# Patient Record
Sex: Female | Born: 1955 | Race: White | Hispanic: No | Marital: Married | Smoking: Never smoker
Health system: Southern US, Community
[De-identification: ages and names within clinical notes are randomized; demographics above are authoritative.]

## PROBLEM LIST (undated history)

## (undated) DIAGNOSIS — E559 Vitamin D deficiency, unspecified: Secondary | ICD-10-CM

## (undated) DIAGNOSIS — M1990 Unspecified osteoarthritis, unspecified site: Secondary | ICD-10-CM

## (undated) DIAGNOSIS — I10 Essential (primary) hypertension: Secondary | ICD-10-CM

## (undated) DIAGNOSIS — F419 Anxiety disorder, unspecified: Secondary | ICD-10-CM

## (undated) DIAGNOSIS — M810 Age-related osteoporosis without current pathological fracture: Secondary | ICD-10-CM

## (undated) DIAGNOSIS — G8929 Other chronic pain: Secondary | ICD-10-CM

## (undated) HISTORY — DX: Anxiety disorder, unspecified: F41.9

## (undated) HISTORY — DX: Essential (primary) hypertension: I10

## (undated) HISTORY — PX: KNEE ARTHROSCOPY W/ MENISCAL REPAIR: SHX1877

## (undated) HISTORY — DX: Age-related osteoporosis without current pathological fracture: M81.0

## (undated) HISTORY — DX: Unspecified osteoarthritis, unspecified site: M19.90

## (undated) HISTORY — DX: Other chronic pain: G89.29

## (undated) HISTORY — DX: Vitamin D deficiency, unspecified: E55.9

---

## 2007-12-02 ENCOUNTER — Other Ambulatory Visit: Admission: RE | Admit: 2007-12-02 | Discharge: 2007-12-02 | Payer: Self-pay | Admitting: Family Medicine

## 2007-12-02 LAB — BASIC METABOLIC PANEL
BUN: 14 mg/dL (ref 4–21)
Creatinine: 0.7 mg/dL (ref 0.5–1.1)
GLUCOSE: 89 mg/dL
POTASSIUM: 4.6 mmol/L (ref 3.4–5.3)
Sodium: 141 mmol/L (ref 137–147)

## 2007-12-02 LAB — TSH: TSH: 1.12 u[IU]/mL (ref 0.41–5.90)

## 2007-12-02 LAB — LIPID PANEL
Cholesterol: 169 mg/dL (ref 0–200)
HDL: 51 mg/dL (ref 35–70)
LDL Cholesterol: 111 mg/dL
Triglycerides: 93 mg/dL (ref 40–160)

## 2007-12-02 LAB — HEPATIC FUNCTION PANEL
ALT: 26 U/L (ref 7–35)
AST: 23 U/L (ref 13–35)
Alkaline Phosphatase: 51 U/L (ref 25–125)
BILIRUBIN, TOTAL: 0.6 mg/dL

## 2009-04-16 ENCOUNTER — Ambulatory Visit: Payer: Self-pay | Admitting: Occupational Medicine

## 2009-04-16 DIAGNOSIS — S139XXA Sprain of joints and ligaments of unspecified parts of neck, initial encounter: Secondary | ICD-10-CM | POA: Insufficient documentation
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
## 2013-02-14 ENCOUNTER — Ambulatory Visit (INDEPENDENT_AMBULATORY_CARE_PROVIDER_SITE_OTHER): Payer: BC Managed Care – PPO | Admitting: Physician Assistant

## 2013-02-14 ENCOUNTER — Encounter: Payer: Self-pay | Admitting: Physician Assistant

## 2013-02-14 VITALS — BP 130/74 | HR 101 | Temp 98.0°F | Wt 188.0 lb

## 2013-02-14 DIAGNOSIS — Z13220 Encounter for screening for lipoid disorders: Secondary | ICD-10-CM

## 2013-02-14 DIAGNOSIS — R5381 Other malaise: Secondary | ICD-10-CM

## 2013-02-14 DIAGNOSIS — S00421A Blister (nonthermal) of right ear, initial encounter: Secondary | ICD-10-CM

## 2013-02-14 DIAGNOSIS — R509 Fever, unspecified: Secondary | ICD-10-CM

## 2013-02-14 DIAGNOSIS — S1092XA Blister (nonthermal) of unspecified part of neck, initial encounter: Secondary | ICD-10-CM

## 2013-02-14 DIAGNOSIS — J029 Acute pharyngitis, unspecified: Secondary | ICD-10-CM

## 2013-02-14 DIAGNOSIS — Z20818 Contact with and (suspected) exposure to other bacterial communicable diseases: Secondary | ICD-10-CM

## 2013-02-14 DIAGNOSIS — R5383 Other fatigue: Secondary | ICD-10-CM

## 2013-02-14 DIAGNOSIS — Z131 Encounter for screening for diabetes mellitus: Secondary | ICD-10-CM

## 2013-02-14 DIAGNOSIS — S0002XA Blister (nonthermal) of scalp, initial encounter: Secondary | ICD-10-CM

## 2013-02-14 DIAGNOSIS — S0082XA Blister (nonthermal) of other part of head, initial encounter: Secondary | ICD-10-CM

## 2013-02-14 DIAGNOSIS — Z2089 Contact with and (suspected) exposure to other communicable diseases: Secondary | ICD-10-CM

## 2013-02-14 LAB — POCT RAPID STREP A (OFFICE): Rapid Strep A Screen: NEGATIVE

## 2013-02-14 MED ORDER — AMOXICILLIN 500 MG PO TABS: 500.0000 mg | ORAL_TABLET | Freq: Two times a day (BID) | ORAL | Status: DC

## 2013-02-14 NOTE — Patient Instructions (Signed)
Mucinex DM twice a day. If not improving in next 48 hours then try Amoxil for 10 days.   Viral Pharyngitis Viral pharyngitis is a viral infection that produces redness, pain, and swelling (inflammation) of the throat. It can spread from person to person (contagious). CAUSES Viral pharyngitis is caused by inhaling a large amount of certain germs called viruses. Many different viruses cause viral pharyngitis. SYMPTOMS Symptoms of viral pharyngitis include:  Sore throat.  Tiredness.  Stuffy nose.  Low-grade fever.  Congestion.  Cough. TREATMENT Treatment includes rest, drinking plenty of fluids, and the use of over-the-counter medication (approved by your caregiver). HOME CARE INSTRUCTIONS   Drink enough fluids to keep your urine clear or pale yellow.  Eat soft, cold foods such as ice cream, frozen ice pops, or gelatin dessert.  Gargle with warm salt water (1 tsp salt per 1 qt of water).  If over age 357, throat lozenges may be used safely.  Only take over-the-counter or prescription medicines for pain, discomfort, or fever as directed by your caregiver. Do not take aspirin. To help prevent spreading viral pharyngitis to others, avoid:  Mouth-to-mouth contact with others.  Sharing utensils for eating and drinking.  Coughing around others. SEEK MEDICAL CARE IF:   You are better in a few days, then become worse.  You have a fever or pain not helped by pain medicines.  There are any other changes that concern you. Document Released: 11/06/2004 Document Revised: 04/21/2011 Document Reviewed: 04/04/2010 [facility] Patient Information 2014 [place], [place].

## 2013-02-14 NOTE — Progress Notes (Signed)
   Subjective:    Patient ID: Beverly Coffey, female    DOB: November 01, 1955, 58 y.o.   MRN: [id number]  HPI Patient is a 58 year old WF who presents to the clinic to establish care. Patient has no ongoing past medical history. She has not seen a doctor in many years and is behind on all health maintenance. She presents today with 5 days of fever, chills, sore throat and sinus pressure. She reports a home temperature of 101.7. She's been taking ibuprofen to keep temperature down. Ibuprofen helps minimally with symptoms that has helped with fever control. Patient denies any ear pain, shortness of breath or wheezing. She does have a dry cough. Her 3 grandchildren were diagnosed with strep last week and she was around them. She continues to eat and drink.    Review of Systems     Objective:   Physical Exam  Constitutional: She is oriented to person, place, and time. She appears well-developed and well-nourished.  HENT:  Head: Normocephalic and atraumatic.  Right Ear: External ear normal.  Left Ear: External ear normal.  Right TM small blister like formation at 6 o clock. Tympanic scarring present.   Left TM clear.   Oropharynx erythematous. Tonsils not enlarged and no exudate.   Eyes: Conjunctivae are normal. Right eye exhibits no discharge. Left eye exhibits no discharge.  Neck: Normal range of motion. Neck supple.  Cardiovascular: Regular rhythm and normal heart sounds.   Tachycardia at 101.  Pulmonary/Chest: Effort normal and breath sounds normal. She has no wheezes.  Lymphadenopathy:    She has no cervical adenopathy.  Neurological: She is alert and oriented to person, place, and time.  Skin: Skin is warm and dry.  Psychiatric: She has a normal mood and affect. Her behavior is normal.          Assessment & Plan:  Acute pharyngitis- Rapid strep negative. Discuss with patient that sore throat is likely coming from sinus drainage. Suggested getting Mucinex DM and use twice a day.  Encouraged patient to consider symptomatic care for sore throat. Honey could help with cough cough as well as sore throat. If not improving in the next 48 hours to give Rx for Amoxil for 10 days if needed.  Right TM ear blister- it appears to be a ear blister on right ear. Pt not aware of any problems with ear. She does report some hearing loss but have been over many years. No gross hearing loss. Will follow up in 4 weeks to see if resolved or still present.   Gave labs for screening purposes. Will order mammo. Follow up in 4 weeks for CPE/PAP.

## 2013-03-14 ENCOUNTER — Encounter: Payer: Self-pay | Admitting: *Deleted

## 2013-03-14 ENCOUNTER — Encounter: Payer: Self-pay | Admitting: Physician Assistant

## 2013-03-14 ENCOUNTER — Other Ambulatory Visit (HOSPITAL_COMMUNITY)
Admission: RE | Admit: 2013-03-14 | Discharge: 2013-03-14 | Disposition: A | Discharge status: Home / Self Care | Payer: BC Managed Care – PPO | Source: Ambulatory Visit | Attending: Family Medicine | Admitting: Family Medicine

## 2013-03-14 ENCOUNTER — Ambulatory Visit (INDEPENDENT_AMBULATORY_CARE_PROVIDER_SITE_OTHER): Payer: BC Managed Care – PPO | Admitting: Physician Assistant

## 2013-03-14 VITALS — BP 122/78 | HR 66 | Wt 188.0 lb

## 2013-03-14 DIAGNOSIS — Z131 Encounter for screening for diabetes mellitus: Secondary | ICD-10-CM

## 2013-03-14 DIAGNOSIS — Z1211 Encounter for screening for malignant neoplasm of colon: Secondary | ICD-10-CM

## 2013-03-14 DIAGNOSIS — R140 Abdominal distension (gaseous): Secondary | ICD-10-CM

## 2013-03-14 DIAGNOSIS — Z1239 Encounter for other screening for malignant neoplasm of breast: Secondary | ICD-10-CM

## 2013-03-14 DIAGNOSIS — Z13220 Encounter for screening for lipoid disorders: Secondary | ICD-10-CM

## 2013-03-14 DIAGNOSIS — R142 Eructation: Secondary | ICD-10-CM

## 2013-03-14 DIAGNOSIS — Z01419 Encounter for gynecological examination (general) (routine) without abnormal findings: Secondary | ICD-10-CM | POA: Insufficient documentation

## 2013-03-14 DIAGNOSIS — R141 Gas pain: Secondary | ICD-10-CM

## 2013-03-14 DIAGNOSIS — R109 Unspecified abdominal pain: Secondary | ICD-10-CM

## 2013-03-14 DIAGNOSIS — Z1151 Encounter for screening for human papillomavirus (HPV): Secondary | ICD-10-CM | POA: Insufficient documentation

## 2013-03-14 DIAGNOSIS — IMO0001 Reserved for inherently not codable concepts without codable children: Secondary | ICD-10-CM

## 2013-03-14 DIAGNOSIS — R143 Flatulence: Secondary | ICD-10-CM

## 2013-03-14 DIAGNOSIS — Z0000 Encounter for general adult medical examination without abnormal findings: Secondary | ICD-10-CM

## 2013-03-14 DIAGNOSIS — R1030 Lower abdominal pain, unspecified: Secondary | ICD-10-CM

## 2013-03-14 LAB — COMPLETE METABOLIC PANEL WITH GFR
ALBUMIN: 4.4 g/dL (ref 3.5–5.2)
ALT: 33 U/L (ref 0–35)
AST: 26 U/L (ref 0–37)
Alkaline Phosphatase: 71 U/L (ref 39–117)
BUN: 23 mg/dL (ref 6–23)
CHLORIDE: 109 meq/L (ref 96–112)
CO2: 23 mEq/L (ref 19–32)
Calcium: 9.2 mg/dL (ref 8.4–10.5)
Creat: 0.76 mg/dL (ref 0.50–1.10)
GFR, Est African American: >89 mL/min
GFR, Est Non African American: 89 mL/min
Glucose, Bld: 90 mg/dL (ref 70–99)
POTASSIUM: 4.4 meq/L (ref 3.5–5.3)
Sodium: 140 mEq/L (ref 135–145)
Total Bilirubin: 0.5 mg/dL (ref 0.2–1.2)
Total Protein: 7 g/dL (ref 6.0–8.3)

## 2013-03-14 LAB — LIPID PANEL
CHOLESTEROL: 194 mg/dL (ref 0–200)
HDL: 48 mg/dL (ref >39)
LDL Cholesterol: 128 mg/dL — ABNORMAL HIGH (ref 0–99)
Total CHOL/HDL Ratio: 4 Ratio
Triglycerides: 92 mg/dL (ref <150)
VLDL: 18 mg/dL (ref 0–40)

## 2013-03-14 LAB — SEDIMENTATION RATE: Sed Rate: 13 mm/hr (ref 0–22)

## 2013-03-14 LAB — TSH: TSH: 1.249 u[IU]/mL (ref 0.350–4.500)

## 2013-03-14 NOTE — Patient Instructions (Addendum)
Will refer to orthopedic specialist.  Pelvic ultrasound.(WED Afternoon) Colonoscopy Baby asprin daily 81 mg.  Ibuprofen 400-600mg  as needed up to three times a day.  Glucosamine chondrotin.    Diet and Irritable Bowel Syndrome  No cure has been found for irritable bowel syndrome (IBS). Many options are available to treat the symptoms. Your caregiver will give you the best treatments available for your symptoms. He or she will also encourage you to manage stress and to make changes to your diet. You need to work with your caregiver and Registered Dietician to find the best combination of medicine, diet, counseling, and support to control your symptoms. The following are some diet suggestions. FOODS THAT MAKE IBS WORSE  Fatty foods, such as [place] fries.  Milk products, such as cheese or ice cream.  Chocolate.  Alcohol.  Caffeine (found in coffee and some sodas).  Carbonated drinks, such as soda. If certain foods cause symptoms, you should eat less of them or stop eating them. FOOD JOURNAL   Keep a journal of the foods that seem to cause distress. Write down:  What you are eating during the day and when.  What problems you are having after eating.  When the symptoms occur in relation to your meals.  What foods always make you feel badly.  Take your notes with you to your caregiver to see if you should stop eating certain foods. FOODS THAT MAKE IBS BETTER Fiber reduces IBS symptoms, especially constipation, because it makes stools soft, bulky, and easier to pass. Fiber is found in bran, bread, cereal, beans, fruit, and vegetables. Examples of foods with fiber include:  Apples.  Peaches.  Pears.  Berries.  Figs.  Broccoli, raw.  Cabbage.  Carrots.  Raw peas.  Kidney beans.  Lima beans.  Whole-grain bread.  Whole-grain cereal. Add foods with fiber to your diet a little at a time. This will let your body get used to them. Too much fiber at once might cause  gas and swelling of your abdomen. This can trigger symptoms in a person with IBS. Caregivers usually recommend a diet with enough fiber to produce soft, painless bowel movements. High fiber diets may cause gas and bloating. However, these symptoms often go away within a few weeks, as your body adjusts. In many cases, dietary fiber may lessen IBS symptoms, particularly constipation. However, it may not help pain or diarrhea. High fiber diets keep the colon mildly enlarged (distended) with the added fiber. This may help prevent spasms in the colon. Some forms of fiber also keep water in the stool, thereby preventing hard stools that are difficult to pass.  Besides telling you to eat more foods with fiber, your caregiver may also tell you to get more fiber by taking a fiber pill or drinking water mixed with a special high fiber powder. An example of this is a natural fiber laxative containing psyllium seed.  TIPS  Large meals can cause cramping and diarrhea in people with IBS. If this happens to you, try eating 4 or 5 small meals a day, or try eating less at each of your usual 3 meals. It may also help if your meals are low in fat and high in carbohydrates. Examples of carbohydrates are pasta, rice, whole-grain breads and cereals, fruits, and vegetables.  If dairy products cause your symptoms to flare up, you can try eating less of those foods. You might be able to handle yogurt better than other dairy products, because it contains bacteria that helps  with digestion. Dairy products are an important source of calcium and other nutrients. If you need to avoid dairy products, be sure to talk with a Registered Dietitian about getting these nutrients through other food sources.  Drink enough water and fluids to keep your urine clear or pale yellow. This is important, especially if you have diarrhea. FOR MORE INFORMATION  International Foundation for Functional Gastrointestinal Disorders: www.iffgd.org  National  Digestive Diseases Information Clearinghouse: digestive.StageSync.si Document Released: 04/19/2003 Document Revised: 04/21/2011 Document Reviewed: 01/04/2007 ExitCare Patient Information 2014 ExitCare, [place].    wKeeping You Healthy  Get These Tests  Blood Pressure- Have your blood pressure checked by your healthcare provider at least once a year.  Normal blood pressure is 120/80.  Weight- Have your body mass index (BMI) calculated to screen for obesity.  BMI is a measure of body fat based on height and weight.  You can calculate your own BMI at https://www.west-esparza.com/  Cholesterol- Have your cholesterol checked every year.  Diabetes- Have your blood sugar checked every year if you have high blood pressure, high cholesterol, a family history of diabetes or if you are overweight.  Pap Smear- Have a pap smear every 1 to 3 years if you have been sexually active.  If you are older than 65 and recent pap smears have been normal you may not need additional pap smears.  In addition, if you have had a hysterectomy  For benign disease additional pap smears are not necessary.  Mammogram-Yearly mammograms are essential for early detection of breast cancer  Screening for Colon Cancer- Colonoscopy starting at age 45. Screening may begin sooner depending on your family history and other health conditions.  Follow up colonoscopy as directed by your Gastroenterologist.  Screening for Osteoporosis- Screening begins at age 74 with bone density scanning, sooner if you are at higher risk for developing Osteoporosis.  Get these medicines  Calcium with Vitamin D- Your body requires 1200-1500 mg of Calcium a day and [phone] IU of Vitamin D a day.  You can only absorb 500 mg of Calcium at a time therefore Calcium must be taken in 2 or 3 separate doses throughout the day.  Hormones- Hormone therapy has been associated with increased risk for certain cancers and heart disease.  Talk to your healthcare  provider about if you need relief from menopausal symptoms.  Aspirin- Ask your healthcare provider about taking Aspirin to prevent Heart Disease and Stroke.  Get these Immuniztions  Flu shot- Every fall  Pneumonia shot- Once after the age of 44; if you are younger ask your healthcare provider if you need a pneumonia shot.  Tetanus- Every ten years.  Zostavax- Once after the age of 39 to prevent shingles.  Take these steps  Don't smoke- Your healthcare provider can help you quit. For tips on how to quit, ask your healthcare provider or go to www.smokefree.gov or call 1-800 QUIT-NOW.  Be physically active- Exercise 5 days a week for a minimum of 30 minutes.  If you are not already physically active, start slow and gradually work up to 30 minutes of moderate physical activity.  Try walking, dancing, bike riding, swimming, etc.  Eat a healthy diet- Eat a variety of healthy foods such as fruits, vegetables, whole grains, low fat milk, low fat cheeses, yogurt, lean meats, chicken, fish, eggs, dried beans, tofu, etc.  For more information go to www.thenutritionsource.org  Dental visit- Brush and floss teeth twice daily; visit your dentist twice a year.  Eye exam- Visit  your Optometrist or Ophthalmologist yearly.  Drink alcohol in moderation- Limit alcohol intake to one drink or less a day.  Never drink and drive.  Depression- Your emotional health is as important as your physical health.  If you're feeling down or losing interest in things you normally enjoy, please talk to your healthcare provider.  Seat Belts- can save your life; always wear one  Smoke/Carbon Monoxide detectors- These detectors need to be installed on the appropriate level of your home.  Replace batteries at least once a year.  Violence- If anyone is threatening or hurting you, please tell your healthcare provider.  Living Will/ Health care power of attorney- Discuss with your healthcare provider and  family.  Menopause Menopause is the normal time of life when menstrual periods stop completely. Menopause is complete when you have missed 12 consecutive menstrual periods. It usually occurs between the ages of 48 years and 55 years. Very rarely does a woman develop menopause before the age of 40 years. At menopause, your ovaries stop producing the female hormones estrogen and progesterone. This can cause undesirable symptoms and also affect your health. Sometimes the symptoms may occur 4 5 years before the menopause begins. There is no relationship between menopause and:  Oral contraceptives.  Number of children you had.  Race.  The age your menstrual periods started (menarche). Heavy smokers and very thin women may develop menopause earlier in life. CAUSES  The ovaries stop producing the female hormones estrogen and progesterone.  Other causes include:  Surgery to remove both ovaries.  The ovaries stop functioning for no known reason.  Tumors of the pituitary gland in the brain.  Medical disease that affects the ovaries and hormone production.  Radiation treatment to the abdomen or pelvis.  Chemotherapy that affects the ovaries. SYMPTOMS   Hot flashes.  Night sweats.  Decrease in sex drive.  Vaginal dryness and thinning of the vagina causing painful intercourse.  Dryness of the skin and developing wrinkles.  Headaches.  Tiredness.  Irritability.  Memory problems.  Weight gain.  Bladder infections.  Hair growth of the face and chest.  Infertility. More serious symptoms include:  Loss of bone (osteoporosis) causing breaks (fractures).  Depression.  Hardening and narrowing of the arteries (atherosclerosis) causing heart attacks and strokes. DIAGNOSIS   When the menstrual periods have stopped for 12 straight months.  Physical exam.  Hormone studies of the blood. TREATMENT  There are many treatment choices and nearly as many questions about them.  The decisions to treat or not to treat menopausal changes is an individual choice made with your health care provider. Your health care provider can discuss the treatments with you. Together, you can decide which treatment will work best for you. Your treatment choices may include:   Hormone therapy (estrogen and progesterone).  Non-hormonal medicines.  Treating the individual symptoms with medicine (for example antidepressants for depression).  Herbal medicines that may help specific symptoms.  Counseling by a psychiatrist or psychologist.  Group therapy.  Lifestyle changes including:  Eating healthy.  Regular exercise.  Limiting caffeine and alcohol.  Stress management and meditation.  No treatment. HOME CARE INSTRUCTIONS   Take the medicine your health care provider gives you as directed.  Get plenty of sleep and rest.  Exercise regularly.  Eat a diet that contains calcium (good for the bones) and soy products (acts like estrogen hormone).  Avoid alcoholic beverages.  Do not smoke.  If you have hot flashes, dress in layers.  Take  supplements, calcium, and vitamin D to strengthen bones.  You can use over-the-counter lubricants or moisturizers for vaginal dryness.  Group therapy is sometimes very helpful.  Acupuncture may be helpful in some cases. SEEK MEDICAL CARE IF:   You are not sure you are in menopause.  You are having menopausal symptoms and need advice and treatment.  You are still having menstrual periods after age 49 years.  You have pain with intercourse.  Menopause is complete (no menstrual period for 12 months) and you develop vaginal bleeding.  You need a referral to a specialist (gynecologist, psychiatrist, or psychologist) for treatment. SEEK IMMEDIATE MEDICAL CARE IF:   You have severe depression.  You have excessive vaginal bleeding.  You fell and think you have a broken bone.  You have pain when you urinate.  You develop leg  or chest pain.  You have a fast pounding heart beat (palpitations).  You have severe headaches.  You develop vision problems.  You feel a lump in your breast.  You have abdominal pain or severe indigestion. Document Released: 04/19/2003 Document Revised: 09/29/2012 Document Reviewed: 08/26/2012 [facility] Patient Information 2014 [place], [place].

## 2013-03-14 NOTE — Progress Notes (Signed)
Subjective:    Patient ID: Beverly Coffey, female    DOB: Apr 24, 1955, 58 y.o.   MRN: [id number]  HPI    Review of Systems     Objective:   Physical Exam        Assessment & Plan:   Subjective:     Beverly Coffey is a 58 y.o. female and is here for a comprehensive physical exam. The patient reports problems - Pt has not seen doctor in over 10 years other than acute sick visits. She comes in concerned with bloating, and tightness of lower abdomen. This has been going on for over a year. bowel movements have not changed but admits to her stools being "loose" sometimes. she is also concerned because her muscles ache all over. denies any joint pain but muscles feel weak and tired. .  No period in over 2 years. Complains of being a little moody and gaining weight.   History   Social History  . Marital Status: Married    Spouse Name: N/A    Number of Children: N/A  . Years of Education: N/A   Occupational History  . Not on file.   Social History Main Topics  . Smoking status: Never Smoker   . Smokeless tobacco: Not on file  . Alcohol Use: No  . Drug Use: No  . Sexual Activity: Yes   Other Topics Concern  . Not on file   Social History Narrative  . No narrative on file   Health Maintenance  Topic Date Due  . Pap Smear  10/21/1975  . Mammogram  10/21/2007  . Colonoscopy  10/21/2007  . Influenza Vaccine  09/10/2012  . Tetanus/tdap  12/01/2017    The following portions of the patient's history were reviewed and updated as appropriate: allergies, current medications, past family history, past medical history, past social history, past surgical history and problem list.  Review of Systems Pertinent items are noted in HPI.   Objective:    BP 122/78  Pulse 66  Wt 188 lb (85.276 kg) General appearance: alert, cooperative and appears stated age Head: Normocephalic, without obvious abnormality, atraumatic Eyes: conjunctivae/corneas clear. PERRL, EOM's intact.  Fundi benign. Ears: normal TM's and external ear canals both ears small fluid filled cyst of right ear seems to be resolving. Nose: Nares normal. Septum midline. Mucosa normal. No drainage or sinus tenderness. Throat: lips, mucosa, and tongue normal; teeth and gums normal Neck: no adenopathy, no carotid bruit, no JVD, supple, symmetrical, trachea midline and thyroid not enlarged, symmetric, no tenderness/mass/nodules Back: symmetric, no curvature. ROM normal. No CVA tenderness. Lungs: clear to auscultation bilaterally Heart: regular rate and rhythm, S1, S2 normal, no murmur, click, rub or gallop Abdomen: soft, non-tender; bowel sounds normal; no masses,  no organomegaly and pt reports some tightness feeling over lower abdomen. Pelvic: cervix normal in appearance, external genitalia normal, no adnexal masses or tenderness, no cervical motion tenderness, uterus normal size, shape, and consistency and vagina normal without discharge Extremities: extremities normal, atraumatic, no cyanosis or edema tenderness over upper back to palpation.  Pulses: 2+ and symmetric Skin: Skin color, texture, turgor normal. No rashes or lesions Lymph nodes: Cervical, supraclavicular, and axillary nodes normal. Neurologic: Grossly normal    Assessment:    Healthy female exam.       Plan:    CPE- will refer for mammogram. Vaccines up to date. Declines flu shot. Will refer for colonoscopy. Encouraged vitamin D and Calicum at least 800units and 1200mg . Will check Vitamin  D today due to history of needed stronger rx. Regular exercise encouraged a week. Depression Screening is negative 0/2.   Muscle pain/myalgia- unclear etiology. Certainly possible fibromyalgia symptoms as pt suspects but i would like to do some blood work and see if anything else comes up. ANA, CBC, Vit B, Vit B12, TSH checked today. Follow up to discussed more in depth.  Bloating/lower abdomen tightness- no PE signs of intense pain or  masses. Will order pelvic ultrasound to be certain.    Obesity- can discussed at later office visit. First step diet and exercise. Did give HO on post-menopausal symptoms.  See After Visit Summary for Counseling Recommendations

## 2013-03-15 LAB — ANA: Anti Nuclear Antibody(ANA): NEGATIVE

## 2013-03-15 LAB — VITAMIN D 25 HYDROXY (VIT D DEFICIENCY, FRACTURES): VIT D 25 HYDROXY: 20 ng/mL — AB (ref 30–89)

## 2013-03-16 ENCOUNTER — Other Ambulatory Visit: Payer: Self-pay | Admitting: Physician Assistant

## 2013-03-16 ENCOUNTER — Telehealth: Payer: Self-pay | Admitting: *Deleted

## 2013-03-16 DIAGNOSIS — IMO0001 Reserved for inherently not codable concepts without codable children: Secondary | ICD-10-CM | POA: Insufficient documentation

## 2013-03-16 DIAGNOSIS — R140 Abdominal distension (gaseous): Secondary | ICD-10-CM

## 2013-03-16 DIAGNOSIS — R1030 Lower abdominal pain, unspecified: Secondary | ICD-10-CM

## 2013-03-16 MED ORDER — VITAMIN D3 1.25 MG (50000 UT) PO CAPS: 50000.0000 [IU] | ORAL_CAPSULE | ORAL | Status: DC

## 2013-03-16 NOTE — Telephone Encounter (Signed)
Vit D3 50,000 units ordered.

## 2013-03-17 ENCOUNTER — Ambulatory Visit (INDEPENDENT_AMBULATORY_CARE_PROVIDER_SITE_OTHER): Payer: BC Managed Care – PPO

## 2013-03-17 DIAGNOSIS — R109 Unspecified abdominal pain: Secondary | ICD-10-CM

## 2013-03-17 DIAGNOSIS — R141 Gas pain: Secondary | ICD-10-CM

## 2013-03-17 DIAGNOSIS — Z1231 Encounter for screening mammogram for malignant neoplasm of breast: Secondary | ICD-10-CM

## 2013-03-17 DIAGNOSIS — R1030 Lower abdominal pain, unspecified: Secondary | ICD-10-CM

## 2013-03-17 DIAGNOSIS — R142 Eructation: Secondary | ICD-10-CM

## 2013-03-17 DIAGNOSIS — R143 Flatulence: Secondary | ICD-10-CM

## 2013-03-17 DIAGNOSIS — R140 Abdominal distension (gaseous): Secondary | ICD-10-CM

## 2013-03-17 DIAGNOSIS — R928 Other abnormal and inconclusive findings on diagnostic imaging of breast: Secondary | ICD-10-CM

## 2013-03-22 ENCOUNTER — Other Ambulatory Visit: Payer: Self-pay | Admitting: Physician Assistant

## 2013-03-22 DIAGNOSIS — R928 Other abnormal and inconclusive findings on diagnostic imaging of breast: Secondary | ICD-10-CM

## 2013-03-30 ENCOUNTER — Other Ambulatory Visit: Payer: BC Managed Care – PPO

## 2013-04-01 ENCOUNTER — Ambulatory Visit
Admission: RE | Admit: 2013-04-01 | Discharge: 2013-04-01 | Disposition: A | Discharge status: Home / Self Care | Payer: BC Managed Care – PPO | Source: Ambulatory Visit | Attending: Physician Assistant | Admitting: Physician Assistant

## 2013-04-01 ENCOUNTER — Other Ambulatory Visit: Payer: BC Managed Care – PPO

## 2013-04-01 ENCOUNTER — Other Ambulatory Visit: Payer: Self-pay | Admitting: Physician Assistant

## 2013-04-01 DIAGNOSIS — N6320 Unspecified lump in the left breast, unspecified quadrant: Secondary | ICD-10-CM

## 2013-04-01 DIAGNOSIS — R928 Other abnormal and inconclusive findings on diagnostic imaging of breast: Secondary | ICD-10-CM

## 2013-04-04 ENCOUNTER — Encounter: Payer: Self-pay | Admitting: Physician Assistant

## 2013-04-04 DIAGNOSIS — N6320 Unspecified lump in the left breast, unspecified quadrant: Secondary | ICD-10-CM | POA: Insufficient documentation

## 2013-04-06 ENCOUNTER — Telehealth: Payer: Self-pay | Admitting: Physician Assistant

## 2013-04-06 ENCOUNTER — Other Ambulatory Visit: Payer: Self-pay | Admitting: Physician Assistant

## 2013-04-06 ENCOUNTER — Ambulatory Visit
Admission: RE | Admit: 2013-04-06 | Discharge: 2013-04-06 | Disposition: A | Discharge status: Home / Self Care | Payer: BC Managed Care – PPO | Source: Ambulatory Visit | Attending: Physician Assistant | Admitting: Physician Assistant

## 2013-04-06 DIAGNOSIS — N6320 Unspecified lump in the left breast, unspecified quadrant: Secondary | ICD-10-CM

## 2013-04-25 ENCOUNTER — Ambulatory Visit: Payer: BC Managed Care – PPO | Admitting: Family Medicine

## 2013-04-25 DIAGNOSIS — Z0289 Encounter for other administrative examinations: Secondary | ICD-10-CM

## 2013-10-05 ENCOUNTER — Telehealth: Payer: Self-pay | Admitting: Physician Assistant

## 2013-10-05 NOTE — Telephone Encounter (Signed)
Call pt: have reminder that needs diagnostic mammogram.

## 2013-10-05 NOTE — Telephone Encounter (Signed)
Message copied by Jomarie Longs on Wed Oct 05, 2013  2:43 PM ------      Message from: Tandy Gaw L      Created: Wed Apr 06, 2013  2:45 PM       6 month diagnostic mammo for left breast mass/asymetry.  ------

## 2013-10-05 NOTE — Telephone Encounter (Signed)
Message copied by Jomarie Longs on Wed Oct 05, 2013  2:45 PM ------      Message from: McFall, Lesly Rubenstein L      Created: Mon Apr 04, 2013  8:11 AM       Left diagnostic breast mammogram ------

## 2013-10-05 NOTE — Telephone Encounter (Signed)
I have reminder for 6 month diagnostic mammogram. Do you have scheduled?

## 2013-10-10 NOTE — Telephone Encounter (Signed)
Left message for patient to return call.

## 2014-04-11 NOTE — Telephone Encounter (Signed)
No note. Needs closed 

## 2017-07-23 ENCOUNTER — Ambulatory Visit: Payer: Self-pay | Admitting: Physician Assistant

## 2018-08-18 ENCOUNTER — Ambulatory Visit: Payer: Self-pay | Admitting: Physician Assistant

## 2018-08-24 ENCOUNTER — Encounter: Payer: Self-pay | Admitting: Physician Assistant

## 2018-08-24 ENCOUNTER — Other Ambulatory Visit: Payer: Self-pay

## 2018-08-24 ENCOUNTER — Other Ambulatory Visit: Payer: Self-pay | Admitting: Physician Assistant

## 2018-08-24 ENCOUNTER — Ambulatory Visit (INDEPENDENT_AMBULATORY_CARE_PROVIDER_SITE_OTHER): Payer: BC Managed Care – PPO | Admitting: Physician Assistant

## 2018-08-24 VITALS — BP 118/68 | HR 80 | Temp 97.8°F | Ht 61.0 in | Wt 169.0 lb

## 2018-08-24 DIAGNOSIS — Z9889 Other specified postprocedural states: Secondary | ICD-10-CM

## 2018-08-24 DIAGNOSIS — E559 Vitamin D deficiency, unspecified: Secondary | ICD-10-CM | POA: Diagnosis not present

## 2018-08-24 DIAGNOSIS — Z1231 Encounter for screening mammogram for malignant neoplasm of breast: Secondary | ICD-10-CM

## 2018-08-24 DIAGNOSIS — Z131 Encounter for screening for diabetes mellitus: Secondary | ICD-10-CM

## 2018-08-24 DIAGNOSIS — N6342 Unspecified lump in left breast, subareolar: Secondary | ICD-10-CM

## 2018-08-24 DIAGNOSIS — M25561 Pain in right knee: Secondary | ICD-10-CM | POA: Diagnosis not present

## 2018-08-24 DIAGNOSIS — Z130 Encounter for screening for diseases of the blood and blood-forming organs and certain disorders involving the immune mechanism: Secondary | ICD-10-CM

## 2018-08-24 DIAGNOSIS — Z1331 Encounter for screening for depression: Secondary | ICD-10-CM

## 2018-08-24 DIAGNOSIS — M25562 Pain in left knee: Secondary | ICD-10-CM | POA: Diagnosis not present

## 2018-08-24 DIAGNOSIS — G8929 Other chronic pain: Secondary | ICD-10-CM | POA: Diagnosis not present

## 2018-08-24 DIAGNOSIS — Z820 Family history of epilepsy and other diseases of the nervous system: Secondary | ICD-10-CM

## 2018-08-24 DIAGNOSIS — Z1329 Encounter for screening for other suspected endocrine disorder: Secondary | ICD-10-CM

## 2018-08-24 DIAGNOSIS — Z1159 Encounter for screening for other viral diseases: Secondary | ICD-10-CM

## 2018-08-24 DIAGNOSIS — Z7689 Persons encountering health services in other specified circumstances: Secondary | ICD-10-CM

## 2018-08-24 DIAGNOSIS — R6889 Other general symptoms and signs: Secondary | ICD-10-CM

## 2018-08-24 DIAGNOSIS — Z8261 Family history of arthritis: Secondary | ICD-10-CM

## 2018-08-24 NOTE — Patient Instructions (Signed)
Chronic Knee Pain, Adult Chronic knee pain is pain in one or both knees that lasts longer than 3 months. Symptoms of chronic knee pain may include swelling, stiffness, and discomfort. Age-related wear and tear (osteoarthritis) of the knee joint is the most common cause of chronic knee pain. Other possible causes include:  A long-term immune-related disease that causes inflammation of the knee (rheumatoid arthritis). This usually affects both knees.  Inflammatory arthritis, such as gout or pseudogout.  An injury to the knee that causes arthritis.  An injury to the knee that damages the ligaments. Ligaments are strong tissues that connect bones to each other.  Runner's knee or pain behind the kneecap. Treatment for chronic knee pain depends on the cause. The main treatments for chronic knee pain are physical therapy and weight loss. This condition may also be treated with medicines, injections, a knee sleeve or brace, and by using crutches. Rest, ice, compression (pressure), and elevation (RICE) therapy may also be recommended. Follow these instructions at home: If you have a knee sleeve or brace:   Wear it as told by your health care provider. Remove it only as told by your health care provider.  Loosen it if your toes tingle, become numb, or turn cold and blue.  Keep it clean.  If the sleeve or brace is not waterproof: ? Do not let it get wet. ? Remove it if allowed by your health care provider, or cover it with a watertight covering when you take a bath or a shower. Managing pain, stiffness, and swelling      If directed, apply heat to the affected area as often as told by your health care provider. Use the heat source that your health care provider recommends, such as a moist heat pack or a heating pad. ? If you have a removable sleeve or brace, remove it as told by your health care provider. ? Place a towel between your skin and the heat source. ? Leave the heat on for 20-30  minutes. ? Remove the heat if your skin turns bright red. This is especially important if you are unable to feel pain, heat, or cold. You may have a greater risk of getting burned.  If directed, put ice on the affected area. ? If you have a removable sleeve or brace, remove it as told by your health care provider. ? Put ice in a plastic bag. ? Place a towel between your skin and the bag. ? Leave the ice on for 20 minutes, 2-3 times a day.  Move your toes often to reduce stiffness and swelling.  Raise (elevate) the injured area above the level of your heart while you are sitting or lying down. Activity  Avoid activities where both feet leave the ground at the same time (high-impact activities). Examples are running, jumping rope, and doing jumping jacks.  Return to your normal activities as told by your health care provider. Ask your health care provider what activities are safe for you.  Follow the exercise plan that your health care provider designed for you. Your health care provider may suggest that you: ? Avoid activities that make knee pain worse. This may require you to change your exercise routines, sport participation, or job duties. ? Wear shoes with cushioned soles. ? Avoid high-impact activities or sports that require running and sudden changes in direction. ? Do physical therapy as told by your health care provider. Physical therapy is planned to match your needs and abilities. It may include  exercises for strength, flexibility, stability, and endurance. ? Do exercises that increase balance and strength, such as tai chi and yoga.  Do not use the injured limb to support your body weight until your health care provider says that you can. Use crutches, a cane, or a walker, as told by your health care provider. General instructions  Take over-the-counter and prescription medicines only as told by your health care provider.  Lose weight if you are overweight. Losing even a little  weight can reduce knee pain. Ask your health care provider what your ideal weight is, and how to safely lose extra weight. A food expert (dietitian) may be able to help you plan your meals.  Do not use any products that contain nicotine or tobacco, such as cigarettes, e-cigarettes, and chewing tobacco. These can delay healing. If you need help quitting, ask your health care provider.  Keep all follow-up visits as told by your health care provider. This is important. Contact a health care provider if:  You have knee pain that is not getting better or gets worse.  You are unable to do your physical therapy exercises due to knee pain. Get help right away if:  Your knee swells and the swelling becomes worse.  You cannot move your knee.  You have severe knee pain. Summary  Knee pain that lasts more than 3 months is considered chronic knee pain.  The main treatments for chronic knee pain are physical therapy and weight loss. You may also need to take medicines, wear a knee sleeve or brace, use crutches, and apply ice or heat.  Losing even a little weight can reduce knee pain. Ask your health care provider what your ideal weight is, and how to safely lose extra weight. A food expert (dietitian) may be able to help you plan your meals.  Work with a physical therapist to make a safe exercise program, as told by your health care provider. This information is not intended to replace advice given to you by your health care provider. Make sure you discuss any questions you have with your health care provider. Document Released: 04/08/2018 Document Revised: 04/08/2018 Document Reviewed: 04/08/2018 Elsevier Patient Education  2020 [employer].

## 2018-08-24 NOTE — Progress Notes (Signed)
HPI:                                                                Beverly Coffey is a 63 y.o. female who presents to [facility] today to establish care  Current concerns: knee pain, vitamin d deficiency  Patient reports chronic bilateral knee pain for approximately 7 years, gradually worsening.  She endorses knee swelling worse on the left side and endorses gelling after prolonged sitting.  She states that her knee pain has gotten so bad that it has affected her gait and she has noted a deformity in her left lower leg where it seems to point inward. Reports hx of Left meniscal rpair approx 8 years ago. Denies any new injury or trauma. She takes Ibuprofen 600 mg prn for the pain, which is mildly helpful. Pain is moderate, persistent, interferes with her activity level.  She reports history of possible RA in her father. Father is deceased from Parkisons disease. She does not believe she has RA. Denies constitutional symptoms incl weight loss or profound fatigue. Denies wrist/hand pain/swelling or reduced ROM of her other joints.  She would like to have her vitamin D level checked today because she has a history of deficiency.   Depression screen PHQ 2/9 08/24/2018  Decreased Interest 1  Down, Depressed, Hopeless 1  PHQ - 2 Score 2  Altered sleeping 1  Tired, decreased energy 1  Change in appetite 1  Feeling bad or failure about yourself  1  Trouble concentrating 0  Moving slowly or fidgety/restless 0  Suicidal thoughts 0  PHQ-9 Score 6    GAD 7 : Generalized Anxiety Score 08/24/2018  Nervous, Anxious, on Edge 1  Control/stop worrying 1  Worry too much - different things 1  Trouble relaxing 1  Restless 0  Easily annoyed or irritable 0  Afraid - awful might happen 0  Total GAD 7 Score 4      No past medical history on file. Past Surgical History:  Procedure Laterality Date  . KNEE ARTHROSCOPY W/ MENISCAL REPAIR     Social  History   Tobacco Use  . Smoking status: Never Smoker  Substance Use Topics  . Alcohol use: No   family history includes Diabetes in her mother.    Review of Systems  Cardiovascular: Positive for leg swelling.  Gastrointestinal: Positive for abdominal pain and constipation.  Endocrine: Positive for heat intolerance.  Musculoskeletal: Positive for arthralgias, back pain and myalgias.  Psychiatric/Behavioral: Positive for sleep disturbance. The patient is nervous/anxious.      Medications: Current Outpatient Medications  Medication Sig Dispense Refill  . Cholecalciferol (VITAMIN D3) 50000 UNITS CAPS Take 50,000 Units by mouth once a week. 8 capsule 0   No current facility-administered medications for this visit.    No Known Allergies     Objective:  BP 118/68   Pulse 80   Temp 97.8 F (36.6 C) (Oral)   Ht '5\' 1"'  (1.549 m)   Wt 169 lb (76.7 kg)   BMI 31.93 kg/m  Gen:  alert, not ill-appearing, no distress, appropriate for age, obese female HEENT: head normocephalic without obvious abnormality, conjunctiva and cornea clear, trachea midline Pulm: Normal work of breathing, normal phonation Neuro: alert and  oriented x 3, no tremor MSK: extremities atraumatic, normal gait and station Knees: bilateral knee swelling L>>R, slight genu varum deformity on the left, left medial joint line tenderness Skin: intact, no rashes on exposed skin, no jaundice, no cyanosis Psych: well-groomed, cooperative, good eye contact, depressed mood, affect is not mood-congruent, speech is articulate, and thought processes clear and goal-directed  Lab Results  Component Value Date   CREATININE 0.76 03/14/2013   BUN 23 03/14/2013   NA 140 03/14/2013   K 4.4 03/14/2013   CL 109 03/14/2013   CO2 23 03/14/2013   Lab Results  Component Value Date   ALT 33 03/14/2013   AST 26 03/14/2013   ALKPHOS 71 03/14/2013   BILITOT 0.5 03/14/2013   No results found for: WBC, HGB, HCT, MCV, PLT   No  results found for this or any previous visit (from the past 72 hour(s)). No results found.    Assessment and Plan: 63 y.o. female with   .[name] was seen today for annual exam.  Diagnoses and all orders for this visit:  Encounter to establish care  Vitamin D deficiency -     Vitamin D 1,25 dihydroxy  Encounter for hepatitis C screening test for low risk patient -     Hepatitis C antibody  Family history of Parkinson disease  Family history of rheumatoid arthritis  Heat intolerance -     TSH + free T4  Screening for thyroid disorder -     TSH + free T4  Screening for blood disease -     COMPLETE METABOLIC PANEL WITH GFR -     CBC  Screening for diabetes mellitus -     COMPLETE METABOLIC PANEL WITH GFR  H/O medial meniscus repair of left knee -     DG Knee Complete 4 Views Left  Chronic pain of right knee -     DG Knee Complete 4 Views Right  Chronic pain of left knee -     DG Knee Complete 4 Views Left  Breast cancer screening by mammogram -     MM Digital Screening Unilat R  Subareolar mass of left breast -     MM Digital Diagnostic Unilat L -     US BREAST LTD UNI LEFT INC AXILLA   - Personally reviewed PMH, PSH, PFH, medications, allergies, HM - Age-appropriate cancer screening: overdue for Mammogram and Pap smear; overdue for Colon Cancer Screening - Tdap UTD per patient - PHQ2 mildly positive, no acute safety issues  Chronic Bilateral Knee Pain Declined rheumatoid work-up. Negative ANA and ESR in 2015. No prior RF/CCP X-rays pending Recommend close f/u with Sports Medicine    Patient education and anticipatory guidance given Patient agrees with treatment plan Follow-up in 1 month for CPE w/fasting lipids and Pap smear  as needed if symptoms worsen or fail to improve  Darlyne Russian PA-C

## 2018-08-26 ENCOUNTER — Encounter: Payer: Self-pay | Admitting: Physician Assistant

## 2018-08-27 ENCOUNTER — Encounter: Payer: Self-pay | Admitting: Physician Assistant

## 2018-08-27 LAB — CBC
HCT: 39.1 % (ref 35.0–45.0)
Hemoglobin: 13.3 g/dL (ref 11.7–15.5)
MCH: 29.2 pg (ref 27.0–33.0)
MCHC: 34 g/dL (ref 32.0–36.0)
MCV: 85.7 fL (ref 80.0–100.0)
MPV: 9.7 fL (ref 7.5–12.5)
Platelets: 315 10*3/uL (ref 140–400)
RBC: 4.56 10*6/uL (ref 3.80–5.10)
RDW: 12.3 % (ref 11.0–15.0)
WBC: 7 10*3/uL (ref 3.8–10.8)

## 2018-08-27 LAB — COMPLETE METABOLIC PANEL WITH GFR
AG Ratio: 1.6 (calc) (ref 1.0–2.5)
ALT: 24 U/L (ref 6–29)
AST: 20 U/L (ref 10–35)
Albumin: 4.5 g/dL (ref 3.6–5.1)
Alkaline phosphatase (APISO): 77 U/L (ref 37–153)
BUN/Creatinine Ratio: 41 (calc) — ABNORMAL HIGH (ref 6–22)
BUN: 28 mg/dL — ABNORMAL HIGH (ref 7–25)
CO2: 26 mmol/L (ref 20–32)
Calcium: 9.6 mg/dL (ref 8.6–10.4)
Chloride: 105 mmol/L (ref 98–110)
Creat: 0.68 mg/dL (ref 0.50–0.99)
GFR, Est African American: 110 mL/min/{1.73_m2} (ref >=60)
GFR, Est Non African American: 95 mL/min/{1.73_m2} (ref >=60)
Globulin: 2.8 g/dL (calc) (ref 1.9–3.7)
Glucose, Bld: 100 mg/dL — ABNORMAL HIGH (ref 65–99)
Potassium: 4.2 mmol/L (ref 3.5–5.3)
Sodium: 138 mmol/L (ref 135–146)
Total Bilirubin: 0.4 mg/dL (ref 0.2–1.2)
Total Protein: 7.3 g/dL (ref 6.1–8.1)

## 2018-08-27 LAB — VITAMIN D 1,25 DIHYDROXY
Vitamin D 1, 25 (OH)2 Total: 65 pg/mL (ref 18–72)
Vitamin D2 1, 25 (OH)2: <8 pg/mL
Vitamin D3 1, 25 (OH)2: 65 pg/mL

## 2018-08-27 LAB — TSH+FREE T4: TSH W/REFLEX TO FT4: 0.83 mIU/L (ref 0.40–4.50)

## 2018-08-27 LAB — HEPATITIS C ANTIBODY
Hepatitis C Ab: NONREACTIVE
SIGNAL TO CUT-OFF: 0.01 (ref <1.00)

## 2018-08-30 ENCOUNTER — Encounter: Payer: Self-pay | Admitting: Physician Assistant

## 2018-08-30 ENCOUNTER — Telehealth: Payer: Self-pay

## 2018-08-30 NOTE — Telephone Encounter (Signed)
Pt left vm requesting Vit D results.  These results were not available last week but they are back now. Please advise. -EH/RMA

## 2018-09-13 ENCOUNTER — Other Ambulatory Visit: Payer: Self-pay

## 2018-09-13 ENCOUNTER — Ambulatory Visit (INDEPENDENT_AMBULATORY_CARE_PROVIDER_SITE_OTHER): Payer: BC Managed Care – PPO

## 2018-09-13 ENCOUNTER — Encounter: Payer: Self-pay | Admitting: Sports Medicine

## 2018-09-13 ENCOUNTER — Ambulatory Visit (INDEPENDENT_AMBULATORY_CARE_PROVIDER_SITE_OTHER): Payer: BC Managed Care – PPO | Admitting: Sports Medicine

## 2018-09-13 DIAGNOSIS — M170 Bilateral primary osteoarthritis of knee: Secondary | ICD-10-CM

## 2018-09-13 MED ORDER — MELOXICAM 15 MG PO TABS: ORAL_TABLET | ORAL | 3 refills | Status: DC

## 2018-09-13 NOTE — Assessment & Plan Note (Signed)
She does appear to be developing a varus deformity. She does need to get her x-rays. Switching to meloxicam, adding formal physical therapy, she will get bilateral knee sleeves. Return to see me in 4 weeks, we can try a steroid injection if no better.

## 2018-09-13 NOTE — Progress Notes (Signed)
Subjective:    CC: Bilateral knee pain  HPI: For years this pleasant 63 year old female has had bilateral knee pain, moderate, persistent, localized at the medial joint lines without radiation, she has noted a steady worsening varus alignment of both knees, and she is having some difficulty straightening out the left knee.  She does have a history of a left partial meniscectomy.  Pain is moderate, persistent.  Localized without radiation.  I reviewed the past medical history, family history, social history, surgical history, and allergies today and no changes were needed.  Please see the problem list section below in epic for further details.  Past Medical History: Past Medical History:  Diagnosis Date  . Anxiety   . Bilateral chronic knee pain   . Vitamin D deficiency    Past Surgical History: Past Surgical History:  Procedure Laterality Date  . KNEE ARTHROSCOPY W/ MENISCAL REPAIR Left    Social History: Social History   Socioeconomic History  . Marital status: Married    Spouse name: Not on file  . Number of children: Not on file  . Years of education: Not on file  . Highest education level: Not on file  Occupational History  . Not on file  Social Needs  . Financial resource strain: Not on file  . Food insecurity    Worry: Not on file    Inability: Not on file  . Transportation needs    Medical: Not on file    Non-medical: Not on file  Tobacco Use  . Smoking status: Never Smoker  . Smokeless tobacco: Never Used  Substance and Sexual Activity  . Alcohol use: No  . Drug use: Never  . Sexual activity: Yes    Birth control/protection: Post-menopausal  Lifestyle  . Physical activity    Days per week: Not on file    Minutes per session: Not on file  . Stress: Not on file  Relationships  . Social Musicianconnections    Talks on phone: Not on file    Gets together: Not on file    Attends religious service: Not on file    Active member of club or organization: Not on file     Attends meetings of clubs or organizations: Not on file    Relationship status: Not on file  Other Topics Concern  . Not on file  Social History Narrative  . Not on file   Family History: Family History  Problem Relation Age of Onset  . Diabetes Mother   . Heart attack Mother   . Hypertension Mother   . Rheum arthritis Father   . Parkinson's disease Father   . Prostate cancer Brother    Allergies: No Known Allergies Medications: See med rec.  Review of Systems: No fevers, chills, night sweats, weight loss, chest pain, or shortness of breath.   Objective:    General: Well Developed, well nourished, and in no acute distress.  Neuro: Alert and oriented x3, extra-ocular muscles intact, sensation grossly intact.  HEENT: Normocephalic, atraumatic, pupils equal round reactive to light, neck supple, no masses, no lymphadenopathy, thyroid nonpalpable.  Skin: Warm and dry, no rashes. Cardiac: Regular rate and rhythm, no murmurs rubs or gallops, no lower extremity edema.  Respiratory: Clear to auscultation bilaterally. Not using accessory muscles, speaking in full sentences. Bilateral knees: Bilateral varus deformity with tenderness at the medial joint lines ROM normal in flexion and extension and lower leg rotation on the right, left knee has about 5 degrees of extension lag. Ligaments  with solid consistent endpoints including ACL, PCL, LCL, MCL. Negative Mcmurray's and provocative meniscal tests. Non painful patellar compression. Patellar and quadriceps tendons unremarkable. Hamstring and quadriceps strength is normal.  X-rays personally reviewed, she has bilateral end-stage osteoarthritis, mostly in the medial compartment with varus deformity.  Impression and Recommendations:    Primary osteoarthritis of both knees She does appear to be developing a varus deformity. She does need to get her x-rays. Switching to meloxicam, adding formal physical therapy, she will get  bilateral knee sleeves. Return to see me in 4 weeks, we can try a steroid injection if no better.   ___________________________________________ Gwen Her. Dianah Field, M.D., ABFM., CAQSM. Primary Care and Sports Medicine [facility]  Adjunct Professor of [employer] of [employer]

## 2018-09-14 ENCOUNTER — Encounter: Payer: Self-pay | Admitting: Sports Medicine

## 2018-09-21 ENCOUNTER — Other Ambulatory Visit (HOSPITAL_COMMUNITY)
Admission: RE | Admit: 2018-09-21 | Discharge: 2018-09-21 | Disposition: A | Discharge status: Home / Self Care | Payer: BC Managed Care – PPO | Source: Ambulatory Visit | Attending: Physician Assistant | Admitting: Physician Assistant

## 2018-09-21 ENCOUNTER — Other Ambulatory Visit: Payer: Self-pay

## 2018-09-21 ENCOUNTER — Ambulatory Visit (INDEPENDENT_AMBULATORY_CARE_PROVIDER_SITE_OTHER): Payer: BC Managed Care – PPO | Admitting: Physician Assistant

## 2018-09-21 VITALS — BP 134/80 | HR 68 | Temp 98.1°F | Wt 174.0 lb

## 2018-09-21 DIAGNOSIS — R7301 Impaired fasting glucose: Secondary | ICD-10-CM | POA: Diagnosis not present

## 2018-09-21 DIAGNOSIS — Z0000 Encounter for general adult medical examination without abnormal findings: Secondary | ICD-10-CM

## 2018-09-21 DIAGNOSIS — B3731 Acute candidiasis of vulva and vagina: Secondary | ICD-10-CM

## 2018-09-21 DIAGNOSIS — E6609 Other obesity due to excess calories: Secondary | ICD-10-CM

## 2018-09-21 DIAGNOSIS — Z124 Encounter for screening for malignant neoplasm of cervix: Secondary | ICD-10-CM | POA: Insufficient documentation

## 2018-09-21 DIAGNOSIS — Z1211 Encounter for screening for malignant neoplasm of colon: Secondary | ICD-10-CM

## 2018-09-21 DIAGNOSIS — R030 Elevated blood-pressure reading, without diagnosis of hypertension: Secondary | ICD-10-CM

## 2018-09-21 DIAGNOSIS — B373 Candidiasis of vulva and vagina: Secondary | ICD-10-CM

## 2018-09-21 DIAGNOSIS — Z13220 Encounter for screening for lipoid disorders: Secondary | ICD-10-CM

## 2018-09-21 MED ORDER — FLUCONAZOLE 150 MG PO TABS: 150.0000 mg | ORAL_TABLET | ORAL | 0 refills | Status: DC

## 2018-09-21 NOTE — Progress Notes (Signed)
HPI:                                                                Naydelin Ziegler is a 63 y.o. female who presents to [facility] today for annual physical exam with Pap smear  Current Concerns include :  She would like to know if Meloxicam causes weight gain because she has noticed weight has gradually increased approx 5 lb over the last month   GYN/Sexual Health  Obstetrics: [id number]  Menstrual status: postmenopausal  Last pap smear: 03/14/13, NILM, HPV negative  History of abnormal pap smears: no  Sexually active:   Current contraception: none, postmenopausal  History of STI: no  Health Maintenance Health Maintenance  Topic Date Due  . HIV Screening  10/20/1970  . MAMMOGRAM  04/02/2015  . PAP SMEAR-Modifier  03/14/2018  . INFLUENZA VACCINE  12/22/2018 (Originally 09/11/2018)  . COLONOSCOPY  08/24/2019 (Originally 10/21/2007)  . TETANUS/TDAP  08/23/2024  . Hepatitis C Screening  Completed    Past Medical History:  Diagnosis Date  . Anxiety   . Bilateral chronic knee pain   . Vitamin D deficiency    Past Surgical History:  Procedure Laterality Date  . KNEE ARTHROSCOPY W/ MENISCAL REPAIR Left    Social History   Tobacco Use  . Smoking status: Never Smoker  . Smokeless tobacco: Never Used  Substance Use Topics  . Alcohol use: No   family history includes Diabetes in her mother; Heart attack in her mother; Hypertension in her mother; Parkinson's disease in her father; Prostate cancer in her brother; Rheum arthritis in her father.  ROS: negative except as noted in the HPI  Medications: Current Outpatient Medications  Medication Sig Dispense Refill  . meloxicam (MOBIC) 15 MG tablet One tab PO qAM with breakfast for 2 weeks, then daily prn pain. 30 tablet 3  . Multiple Vitamins-Minerals (MULTIVITAMIN WITH MINERALS) tablet Take 1 tablet by mouth daily.     No current facility-administered medications for this  visit.    No Known Allergies     Objective:  BP 140/78   Pulse 68   Temp 98.1 F (36.7 C)   Wt 174 lb (78.9 kg)   BMI 32.88 kg/m   Vitals:   09/21/18 1103  BP: 140/78  Pulse: 68  Temp: 98.1 F (36.7 C)   Wt Readings from Last 3 Encounters:  09/21/18 174 lb (78.9 kg)  09/13/18 169 lb (76.7 kg)  08/24/18 169 lb (76.7 kg)   Temp Readings from Last 3 Encounters:  09/21/18 98.1 F (36.7 C)  09/13/18 97.7 F (36.5 C) (Oral)  08/24/18 97.8 F (36.6 C) (Oral)   BP Readings from Last 3 Encounters:  09/21/18 140/78  09/13/18 134/76  08/24/18 118/68   Pulse Readings from Last 3 Encounters:  09/21/18 68  09/13/18 70  08/24/18 80    General Appearance:  Alert, cooperative, no distress, appropriate for age, obese female                            Head:  Normocephalic, without obvious abnormality  Eyes:  PERRL, EOM's intact, conjunctiva and cornea clear                             Ears:  TM pearly gray color and semitransparent, external ear canals normal, both ears                            Nose:  Nares symmetrical, mucosa pink                          Throat:  Lips, tongue, and mucosa are moist, pink, and intact; oropharynx clear, uvula midline; good dentition                             Neck:  Supple; symmetrical, trachea midline, no adenopathy; thyroid: no enlargement, symmetric, no tenderness/mass/nodules; no carotid bruit                             Back:  Symmetrical, no curvature, ROM normal               Chest/Breast:  No tenderness, masses or nipple abnormality. No dimpling or discharge                           Lungs:  Clear to auscultation bilaterally, respirations unlabored                             Heart:  normal rate & regular rhythm, S1 and S2 normal, no murmurs, rubs, or gallops                     Abdomen:  Soft, non-tender, no mass or organomegaly              Genitourinary:  vulva and inguinal region with red patchy rash, no  lesions, normal introitus and urethral meatus, vaginal mucosa without erythema, normal discharge, cervix non-friable without lesions         Musculoskeletal:  Tone and strength strong and symmetrical, all extremities; trace peripheral edema of bilateral lower extremities, normal gait and station                                      Lymphatic:  No adenopathy             Skin/Hair/Nails:  Skin warm, dry and intact, no rashes or abnormal dyspigmentation on limited exam                   Neurologic:  Alert and oriented x3, no cranial nerve deficits, DTR's intact, sensation grossly intact, normal gait and station, no tremor Psych: well-groomed, cooperative, good eye contact, euthymic mood, affect mood-congruent, speech is articulate, and thought processes clear and goal-directed    A chaperone was present for the GU portion of the exam, Olivia MackieEvonia Henry, RMA.    No results found for this or any previous visit (from the past 72 hour(s)). No results found.    Assessment and Plan: 63 y.o. female with  .Alona BeneJoyce was seen today for gynecologic exam.  Diagnoses and all orders for this visit:  Encounter for  Pap smear of cervix with HPV DNA cotesting -     Cytology - PAP  Colon cancer screening -     Cologuard  Encounter for annual physical exam -     Hemoglobin A1c -     Lipid Panel w/reflex Direct LDL  Fasting hyperglycemia -     Hemoglobin A1c  Screening for lipid disorders -     Lipid Panel w/reflex Direct LDL  Elevated blood pressure reading  Class 1 obesity due to excess calories in adult, unspecified BMI, unspecified whether serious comorbidity present  Vulvar candidiasis -     fluconazole (DIFLUCAN) 150 MG tablet; Take 1 tablet (150 mg total) by mouth once a week for 4 doses.   - Personally reviewed PMH, PSH, PFH, medications, allergies, HM - Age-appropriate cancer screening: Pap smear pending; mammogram ordered, needs to be performed at breast center, provided with phone number  to schedule; Colonoscopy declined, Cologuard ordered - Influenza not in stock - Tdap UTD - PHQ2 negative - Declined STI screening - BP out of range - home BP monitoring, counseled on therapeutic lifestyle changes - BMI>30, counseled on weight loss through decreased caloric intake and increased aerobic exercise - Fasting labs completed 08/24/18 and reviewed, adding A1C for hyperglycemia and fasting lipids   Patient education and anticipatory guidance given Patient agrees with treatment plan Follow-up based on Pap results or sooner as needed  Levonne Hubertharley E. Cummings PA-C

## 2018-09-21 NOTE — Patient Instructions (Addendum)
Call [facility] to schedule Mammogram and Ultrasound  [phone]   For your blood pressure: - Goal <130/80 (Ideally 120's/70's) - monitor and log blood pressures at home - check around the same time each day in a relaxed setting - Limit salt to <2500 mg/day - Follow DASH (Dietary Approach to Stopping Hypertension) eating plan - Try to get at least 150 minutes of aerobic exercise per week - Aim to go on a brisk walk 30 minutes per day at least 5 days per week. If you're not active, gradually increase how long you walk by 5 minutes each week - limit alcohol: 2 standard drinks per day for men and 1 per day for women - avoid tobacco/nicotine products. Consider smoking cessation if you smoke - weight loss: 7% of current body weight can reduce your blood pressure by 5-10 points - follow-up at least every 6 months for your blood pressure. Follow-up sooner if your BP is not controlled   Prediabetes Eating Plan Prediabetes is a condition that causes blood sugar (glucose) levels to be higher than normal. This increases the risk for developing diabetes. In order to prevent diabetes from developing, your health care provider may recommend a diet and other lifestyle changes to help you:  Control your blood glucose levels.  Improve your cholesterol levels.  Manage your blood pressure. Your health care provider may recommend working with a diet and nutrition specialist (dietitian) to make a meal plan that is best for you. What are tips for following this plan? Lifestyle  Set weight loss goals with the help of your health care team. It is recommended that most people with prediabetes lose 7% of their current body weight.  Exercise for at least 30 minutes at least 5 days a week.  Attend a support group or seek ongoing support from a mental health counselor.  Take over-the-counter and prescription medicines only as told by your health care provider. Reading food labels  Read food labels  to check the amount of fat, salt (sodium), and sugar in prepackaged foods. Avoid foods that have: ? Saturated fats. ? Trans fats. ? Added sugars.  Avoid foods that have more than 300 milligrams (mg) of sodium per serving. Limit your daily sodium intake to less than 2,300 mg each day. Shopping  Avoid buying pre-made and processed foods. Cooking  Cook with olive oil. Do not use butter, lard, or ghee.  Bake, broil, grill, or boil foods. Avoid frying. Meal planning   Work with your dietitian to develop an eating plan that is right for you. This may include: ? Tracking how many calories you take in. Use a food diary, notebook, or mobile application to track what you eat at each meal. ? Using the glycemic index (GI) to plan your meals. The index tells you how quickly a food will raise your blood glucose. Choose low-GI foods. These foods take a longer time to raise blood glucose.  Consider following a Mediterranean diet. This diet includes: ? Several servings each day of fresh fruits and vegetables. ? Eating fish at least twice a week. ? Several servings each day of whole grains, beans, nuts, and seeds. ? Using olive oil instead of other fats. ? Moderate alcohol consumption. ? Eating small amounts of red meat and whole-fat dairy.  If you have high blood pressure, you may need to limit your sodium intake or follow a diet such as the DASH eating plan. DASH is an eating plan that aims to lower high blood pressure.  What foods are recommended? The items listed below may not be a complete list. Talk with your dietitian about what dietary choices are best for you. Grains Whole grains, such as whole-wheat or whole-grain breads, crackers, cereals, and pasta. Unsweetened oatmeal. Bulgur. Barley. Quinoa. Brown rice. Corn or whole-wheat flour tortillas or taco shells. Vegetables Lettuce. Spinach. Peas. Beets. Cauliflower. Cabbage. Broccoli. Carrots. Tomatoes. Squash. Eggplant. Herbs. Peppers. Onions.  Cucumbers. Brussels sprouts. Fruits Berries. Bananas. Apples. Oranges. Grapes. Papaya. Mango. Pomegranate. Kiwi. Grapefruit. Cherries. Meats and other protein foods Seafood. Poultry without skin. Lean cuts of pork and beef. Tofu. Eggs. Nuts. Beans. Dairy Low-fat or fat-free dairy products, such as yogurt, cottage cheese, and cheese. Beverages Water. Tea. Coffee. Sugar-free or diet soda. Seltzer water. Lowfat or no-fat milk. Milk alternatives, such as soy or almond milk. Fats and oils Olive oil. Canola oil. Sunflower oil. Grapeseed oil. Avocado. Walnuts. Sweets and desserts Sugar-free or low-fat pudding. Sugar-free or low-fat ice cream and other frozen treats. Seasoning and other foods Herbs. Sodium-free spices. Mustard. Relish. Low-fat, low-sugar ketchup. Low-fat, low-sugar barbecue sauce. Low-fat or fat-free mayonnaise. What foods are not recommended? The items listed below may not be a complete list. Talk with your dietitian about what dietary choices are best for you. Grains Refined white flour and flour products, such as bread, pasta, snack foods, and cereals. Vegetables Canned vegetables. Frozen vegetables with butter or cream sauce. Fruits Fruits canned with syrup. Meats and other protein foods Fatty cuts of meat. Poultry with skin. Breaded or fried meat. Processed meats. Dairy Full-fat yogurt, cheese, or milk. Beverages Sweetened drinks, such as sweet iced tea and soda. Fats and oils Butter. Lard. Ghee. Sweets and desserts Baked goods, such as cake, cupcakes, pastries, cookies, and cheesecake. Seasoning and other foods Spice mixes with added salt. Ketchup. Barbecue sauce. Mayonnaise. Summary  To prevent diabetes from developing, you may need to make diet and other lifestyle changes to help control blood sugar, improve cholesterol levels, and manage your blood pressure.  Set weight loss goals with the help of your health care team. It is recommended that most people with  prediabetes lose 7 percent of their current body weight.  Consider following a Mediterranean diet that includes plenty of fresh fruits and vegetables, whole grains, beans, nuts, seeds, fish, lean meat, low-fat dairy, and healthy oils. This information is not intended to replace advice given to you by your health care provider. Make sure you discuss any questions you have with your health care provider. Document Released: 06/13/2014 Document Revised: 05/21/2018 Document Reviewed: 04/02/2016 Elsevier Patient Education  2020 [employer]   Preventive Care 41-51 Years Old, Female Preventive care refers to visits with your health care provider and lifestyle choices that can promote health and wellness. This includes:  A yearly physical exam. This may also be called an annual well check.  Regular dental visits and eye exams.  Immunizations.  Screening for certain conditions.  Healthy lifestyle choices, such as eating a healthy diet, getting regular exercise, not using drugs or products that contain nicotine and tobacco, and limiting alcohol use. What can I expect for my preventive care visit? Physical exam Your health care provider will check your:  Height and weight. This may be used to calculate body mass index (BMI), which tells if you are at a healthy weight.  Heart rate and blood pressure.  Skin for abnormal spots. Counseling Your health care provider may ask you questions about your:  Alcohol, tobacco, and drug use.  Emotional well-being.  Home and relationship well-being.  Sexual activity.  Eating habits.  Work and work Statistician.  Method of birth control.  Menstrual cycle.  Pregnancy history. What immunizations do I need?  Influenza (flu) vaccine  This is recommended every year. Tetanus, diphtheria, and pertussis (Tdap) vaccine  You may need a Td booster every 10 years. Varicella (chickenpox) vaccine  You may need this if you have not been  vaccinated. Zoster (shingles) vaccine  You may need this after age 9. Measles, mumps, and rubella (MMR) vaccine  You may need at least one dose of MMR if you were born in 1957 or later. You may also need a second dose. Pneumococcal conjugate (PCV13) vaccine  You may need this if you have certain conditions and were not previously vaccinated. Pneumococcal polysaccharide (PPSV23) vaccine  You may need one or two doses if you smoke cigarettes or if you have certain conditions. Meningococcal conjugate (MenACWY) vaccine  You may need this if you have certain conditions. Hepatitis A vaccine  You may need this if you have certain conditions or if you travel or work in places where you may be exposed to hepatitis A. Hepatitis B vaccine  You may need this if you have certain conditions or if you travel or work in places where you may be exposed to hepatitis B. Haemophilus influenzae type b (Hib) vaccine  You may need this if you have certain conditions. Human papillomavirus (HPV) vaccine  If recommended by your health care provider, you may need three doses over 6 months. You may receive vaccines as individual doses or as more than one vaccine together in one shot (combination vaccines). Talk with your health care provider about the risks and benefits of combination vaccines. What tests do I need? Blood tests  Lipid and cholesterol levels. These may be checked every 5 years, or more frequently if you are over 2 years old.  Hepatitis C test.  Hepatitis B test. Screening  Lung cancer screening. You may have this screening every year starting at age 72 if you have a 30-pack-year history of smoking and currently smoke or have quit within the past 15 years.  Colorectal cancer screening. All adults should have this screening starting at age 44 and continuing until age 17. Your health care provider may recommend screening at age 40 if you are at increased risk. You will have tests every  1-10 years, depending on your results and the type of screening test.  Diabetes screening. This is done by checking your blood sugar (glucose) after you have not eaten for a while (fasting). You may have this done every 1-3 years.  Mammogram. This may be done every 1-2 years. Talk with your health care provider about when you should start having regular mammograms. This may depend on whether you have a family history of breast cancer.  BRCA-related cancer screening. This may be done if you have a family history of breast, ovarian, tubal, or peritoneal cancers.  Pelvic exam and Pap test. This may be done every 3 years starting at age 25. Starting at age 76, this may be done every 5 years if you have a Pap test in combination with an HPV test. Other tests  Sexually transmitted disease (STD) testing.  Bone density scan. This is done to screen for osteoporosis. You may have this scan if you are at high risk for osteoporosis. Follow these instructions at home: Eating and drinking  Eat a diet that includes fresh fruits and vegetables, whole grains, lean  protein, and low-fat dairy.  Take vitamin and mineral supplements as recommended by your health care provider.  Do not drink alcohol if: ? Your health care provider tells you not to drink. ? You are pregnant, may be pregnant, or are planning to become pregnant.  If you drink alcohol: ? Limit how much you have to 0-1 drink a day. ? Be aware of how much alcohol is in your drink. In the [place] one drink equals one 12 oz bottle of beer (355 mL), one 5 oz glass of wine (148 mL), or one 1 oz glass of hard liquor (44 mL). Lifestyle  Take daily care of your teeth and gums.  Stay active. Exercise for at least 30 minutes on 5 or more days each week.  Do not use any products that contain nicotine or tobacco, such as cigarettes, e-cigarettes, and chewing tobacco. If you need help quitting, ask your health care provider.  If you are sexually active,  practice safe sex. Use a condom or other form of birth control (contraception) in order to prevent pregnancy and STIs (sexually transmitted infections).  If told by your health care provider, take low-dose aspirin daily starting at age 57. What's next?  Visit your health care provider once a year for a well check visit.  Ask your health care provider how often you should have your eyes and teeth checked.  Stay up to date on all vaccines. This information is not intended to replace advice given to you by your health care provider. Make sure you discuss any questions you have with your health care provider. Document Released: 02/23/2015 Document Revised: 10/08/2017 Document Reviewed: 10/08/2017 Elsevier Patient Education  2020 [employer].

## 2018-09-22 LAB — HEMOGLOBIN A1C
Hgb A1c MFr Bld: 5.2 % of total Hgb (ref <5.7)
Mean Plasma Glucose: 103 (calc)
eAG (mmol/L): 5.7 (calc)

## 2018-09-22 LAB — LIPID PANEL W/REFLEX DIRECT LDL
Cholesterol: 198 mg/dL (ref <200)
HDL: 58 mg/dL (ref >=50)
LDL Cholesterol (Calc): 119 mg/dL (calc) — ABNORMAL HIGH
Non-HDL Cholesterol (Calc): 140 mg/dL (calc) — ABNORMAL HIGH (ref <130)
Total CHOL/HDL Ratio: 3.4 (calc) (ref <5.0)
Triglycerides: 105 mg/dL (ref <150)

## 2018-09-23 LAB — CYTOLOGY - PAP
Diagnosis: NEGATIVE
HPV: NOT DETECTED

## 2018-09-24 ENCOUNTER — Other Ambulatory Visit: Payer: Self-pay

## 2018-09-24 ENCOUNTER — Ambulatory Visit (INDEPENDENT_AMBULATORY_CARE_PROVIDER_SITE_OTHER): Payer: BC Managed Care – PPO | Admitting: Physical Therapy

## 2018-09-24 ENCOUNTER — Encounter: Payer: Self-pay | Admitting: Physical Therapy

## 2018-09-24 DIAGNOSIS — M25561 Pain in right knee: Secondary | ICD-10-CM

## 2018-09-24 DIAGNOSIS — R2689 Other abnormalities of gait and mobility: Secondary | ICD-10-CM

## 2018-09-24 DIAGNOSIS — M6281 Muscle weakness (generalized): Secondary | ICD-10-CM

## 2018-09-24 DIAGNOSIS — G8929 Other chronic pain: Secondary | ICD-10-CM

## 2018-09-24 DIAGNOSIS — M25562 Pain in left knee: Secondary | ICD-10-CM | POA: Diagnosis not present

## 2018-09-24 NOTE — Therapy (Signed)
[facility] [address] [place], [place], [address] Phone: [phone]   Fax:  [phone]  Physical Therapy Evaluation  Patient Details  Name: Beverly ComberJoyce Coffey MRN: [id number] Date of Birth: 09-01-1955 Referring Provider (PT): Monica Bectonhekkekandam, Thomas J, MD   Encounter Date: 09/24/2018  PT End of Session - 09/24/18 1204    Visit Number  1    Number of Visits  12    Date for PT Re-Evaluation  11/05/18    PT Start Time  1107    PT Stop Time  1153    PT Time Calculation (min)  46 min    Activity Tolerance  Patient tolerated treatment well    Behavior During Therapy  [facility] for tasks assessed/performed       Past Medical History:  Diagnosis Date  . Anxiety   . Bilateral chronic knee pain   . Vitamin D deficiency     Past Surgical History:  Procedure Laterality Date  . KNEE ARTHROSCOPY W/ MENISCAL REPAIR Left     There were no vitals filed for this visit.   Subjective Assessment - 09/24/18 1109    Subjective  Pt is a 63 y/o female who presents to OPPT for severe OA in bil knees.  Pt with hx of Lt meniscus tear with repair ~ 8 years ago, then feels she favored Rt knee creating more pain in this knee as well.  Pt present today with c/o pain, difficulty moving and progressive gait changes.    Limitations  Standing;Walking   stairs   How long can you sit comfortably?  pain with standing following sitting    How long can you stand comfortably?  20 min    How long can you walk comfortably?  10-15 min (walks 25 min daily)    Patient Stated Goals  improve pain    Currently in Pain?  Yes    Pain Score  0-No pain   up to 8/10   Pain Location  Knee    Pain Orientation  Left;Right    Pain Descriptors / Indicators  Aching;Dull;Sharp;Jabbing    Pain Type  Chronic pain    Pain Onset  More than a month ago    Pain Frequency  Intermittent    Aggravating Factors   stairs; standing up after prolonged sitting; walking, unable to get  down to floor    Pain Relieving Factors  ibuprofen and meloxicam         [facility] PT Assessment - 09/24/18 1115      Assessment   Medical Diagnosis  M17.0 (ICD-10-CM) - Primary osteoarthritis of both knees    Referring Provider (PT)  Monica Bectonhekkekandam, Thomas J, MD    Onset Date/Surgical Date  --   7 years; worsening x 2 years   Hand Dominance  Right    Next MD Visit  10/11/18    Prior Therapy  following meniscus repair      Precautions   Precautions  None      Restrictions   Weight Bearing Restrictions  No      Balance Screen   Has the patient fallen in the past 6 months  No    Has the patient had a decrease in activity level because of a fear of falling?   No    Is the patient reluctant to leave their home because of a fear of falling?   No      Home Public house managernvironment   Living Environment  Private residence  Living Arrangements  Children;Alone   family   Type of [place] to enter    Entrance Stairs-Number of Steps  1    [place]  One level      Prior Function   Level of [place]  Works at home    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)    Leisure  shopping, get hair done, play with grandchildren; 25 min walking daily      Cognition   Overall Cognitive Status  Within Functional Limits for tasks assessed      Observation/Other Assessments   Focus on Therapeutic Outcomes (FOTO)   61 (39% limited; predicted 28% limited)      Posture/Postural Control   Posture Comments  Rt knee genu varus; Lt knee lacking ~ 8 degrees extension in stance      ROM / Strength   AROM / PROM / Strength  AROM;Strength;PROM      AROM   AROM Assessment Site  Knee    Right/Left Knee  Right;Left    Right Knee Extension  0    Right Knee Flexion  109    Left Knee Extension  -9    Left Knee Flexion  86      PROM   PROM Assessment Site  Knee    Right/Left Knee  Left    Left Knee Extension  -5    Left Knee Flexion  95       Strength   Overall Strength Comments  give way weakness present with all testing    Strength Assessment Site  Hip;Knee    Right/Left Hip  Right;Left    Right Hip Flexion  3+/5    Right Hip Extension  3-/5    Right Hip External Rotation   3+/5    Right Hip Internal Rotation  3+/5    Right Hip ABduction  3/5    Left Hip Flexion  3+/5    Left Hip Extension  3-/5    Left Hip External Rotation  3+/5    Left Hip Internal Rotation  3+/5    Left Hip ABduction  3+/5    Right/Left Knee  Right;Left    Right Knee Flexion  4/5    Right Knee Extension  4/5    Left Knee Flexion  3-/5    Left Knee Extension  4/5      Flexibility   Soft Tissue Assessment /Muscle Length  yes    Hamstrings  tightness Lt >Rt    Quadriceps  tightness Lt>Rt      Ambulation/Gait   Gait Pattern  Antalgic                Objective measurements completed on examination: See above findings.      [facility] PT Treatment/Exercise - 09/24/18 1115      Exercises   Exercises  Knee/Hip      Knee/Hip Exercises: Stretches   Passive Hamstring Stretch  1 rep;30 seconds    Passive Hamstring Stretch Limitations  supine with strap    Quad Stretch  1 rep;30 seconds    Quad Stretch Limitations  prone with strap      Knee/Hip Exercises: Supine   Bridges  5 reps    Straight Leg Raises  Left;5 reps      Knee/Hip Exercises: Sidelying   Hip ABduction  Left;5 reps  PT Education - 09/24/18 1204    Education Details  HEP    Person(s) Educated  Patient    Methods  Explanation;Demonstration;Handout    Comprehension  Verbalized understanding;Returned demonstration;Need further instruction          PT Long Term Goals - 09/24/18 1208      PT LONG TERM GOAL #1   Title  independent with HEP    Status  New    Target Date  11/05/18      PT LONG TERM GOAL #2   Title  FOTO score improved to </= 28% limited for improved function    Status  New    Target Date  11/05/18      PT LONG TERM  GOAL #3   Title  report ability to walk at least 25-30 min without increase in pain for improved function    Status  New    Target Date  11/05/18      PT LONG TERM GOAL #4   Title  negotiate stairs reciprocally (with 1 handrail or less) with pain < 4/10 for improved function and mobility    Status  New    Target Date  11/05/18      PT LONG TERM GOAL #5   Title  demonstrate at least 4/5 bil LE strength for improved function    Status  New    Target Date  11/05/18             Plan - 09/24/18 1204    Clinical Impression Statement  Pt is a 63 y/o female who presents to OPPT for chronic bil knee pain, Lt worse than Rt.  Pt with dx OA on x ray.  Pt demonstrates decreased ROM, strength and flexibility affecting functional mobilty.  Pt will benefit from PT to address deficits listed.    Personal Factors and Comorbidities  Comorbidity 1    Comorbidities  OA    Examination-Activity Limitations  Squat;Sit;Stand;Locomotion Level;Stairs;Caring for Others    Examination-Participation Restrictions  Yard Work;Driving;Shop    Stability/Clinical Decision Making  Stable/Uncomplicated    Clinical Decision Making  Low    Rehab Potential  Good    PT Frequency  2x / week    PT Duration  6 weeks    PT Treatment/Interventions  ADLs/Self Care Home Management;Cryotherapy;Electrical Stimulation;Iontophoresis 4mg /ml Dexamethasone;Moist Heat;Balance training;Therapeutic exercise;Therapeutic activities;Functional mobility training;Stair training;Gait training;Ultrasound;Patient/family education;Manual techniques;Vasopneumatic Device;Taping;Dry needling;Passive range of motion    PT Next Visit Plan  review HEP and progress as able (add piriformis stretch and seated/supine therex), manual/modalities/taping PRN    PT Home Exercise Plan  Access Code: [email or web address]       Patient will benefit from skilled therapeutic intervention in order to improve the following deficits and impairments:  Abnormal gait, Pain,  Decreased range of motion, Impaired flexibility, Difficulty walking, Decreased mobility, Decreased strength  Visit Diagnosis: 1. Chronic pain of left knee   2. Chronic pain of right knee   3. Other abnormalities of gait and mobility   4. Muscle weakness (generalized)        Problem List Patient Active Problem List   Diagnosis Date Noted  . Primary osteoarthritis of both knees 09/13/2018  . Left breast mass 04/04/2013  . Myalgia and myositis, unspecified 03/16/2013  . CERVICAL STRAIN 04/16/2009      Clarita CraneStephanie F Charles Andringa, PT, DPT 09/24/18 12:14 PM     [facility] [address] [place], [place], [address] Phone: [phone]   Fax:  [phone]  Name: Beverly ComberJoyce Coffey MRN: [id number] Date of Birth: [birth year]

## 2018-09-24 NOTE — Patient Instructions (Signed)
Access Code: [email or web address]  URL: [email or web address]  Date: 09/24/2018  Prepared by: Faustino Congress   Exercises  Supine Hamstring Stretch with Strap - 3 reps - 1 sets - 30 sec hold - 2x daily - 7x weekly  Prone Quadriceps Stretch with Strap - 3 reps - 1 sets - 30 sec hold - 2x daily - 7x weekly  Small Range Straight Leg Raise - 10 reps - 1 sets - 2x daily - 7x weekly  Supine Bridge - 10 reps - 1 sets - 5 sec hold - 2x daily - 7x weekly  Sidelying Hip Abduction - 1 sets - 10 reps - 2x daily - 7x weekly

## 2018-09-26 ENCOUNTER — Encounter: Payer: Self-pay | Admitting: Sports Medicine

## 2018-09-28 ENCOUNTER — Other Ambulatory Visit: Payer: Self-pay

## 2018-09-28 ENCOUNTER — Ambulatory Visit (INDEPENDENT_AMBULATORY_CARE_PROVIDER_SITE_OTHER): Payer: BC Managed Care – PPO | Admitting: Physical Therapy

## 2018-09-28 DIAGNOSIS — R2689 Other abnormalities of gait and mobility: Secondary | ICD-10-CM

## 2018-09-28 DIAGNOSIS — M25562 Pain in left knee: Secondary | ICD-10-CM

## 2018-09-28 DIAGNOSIS — M6281 Muscle weakness (generalized): Secondary | ICD-10-CM

## 2018-09-28 DIAGNOSIS — G8929 Other chronic pain: Secondary | ICD-10-CM

## 2018-09-28 DIAGNOSIS — M25561 Pain in right knee: Secondary | ICD-10-CM

## 2018-09-28 NOTE — Patient Instructions (Addendum)

## 2018-09-28 NOTE — Therapy (Signed)
[facility] [address] [place], [place], [address] Phone: [phone]   Fax:  [phone]  Physical Therapy Treatment  Patient Details  Name: Beverly Coffey MRN: [id number] Date of Birth: 1955/03/23 Referring Provider (PT): Monica Bectonhekkekandam, Thomas J, MD   Encounter Date: 09/28/2018  PT End of Session - 09/28/18 1146    Visit Number  2    Number of Visits  12    Date for PT Re-Evaluation  11/05/18    PT Start Time  1100    PT Stop Time  1144    PT Time Calculation (min)  44 min       Past Medical History:  Diagnosis Date  . Anxiety   . Bilateral chronic knee pain   . Vitamin D deficiency     Past Surgical History:  Procedure Laterality Date  . KNEE ARTHROSCOPY W/ MENISCAL REPAIR Left     There were no vitals filed for this visit.  Subjective Assessment - 09/28/18 1107    Subjective  Pt reports she has been doing her exercises daily.  she bought a stretch out strap. She found out she has a high co-pay so she will have to reduce her visits.    Limitations  Standing;Walking    Patient Stated Goals  improve pain    Currently in Pain?  Yes    Pain Score  5    took meloxicam 1.5 hr prior to appt.   Pain Location  Knee    Pain Orientation  Left;Right    Pain Descriptors / Indicators  Aching;Sharp    Aggravating Factors   standing, walking, stairs    Pain Relieving Factors  medicine         [facility] PT Assessment - 09/28/18 0001      Assessment   Medical Diagnosis  M17.0 (ICD-10-CM) - Primary osteoarthritis of both knees    Referring Provider (PT)  Monica Bectonhekkekandam, Thomas J, MD    Onset Date/Surgical Date  --   7 years; worsening x 2 years   Hand Dominance  Right    Next MD Visit  10/11/18    Prior Therapy  following meniscus repair      AROM   Left Knee Extension  -9    Left Knee Flexion  87      Flexibility   Quadriceps  Lt quad 67 deg        [facility] Adult PT Treatment/Exercise - 09/28/18 0001      Knee/Hip Exercises: Stretches   Passive Hamstring Stretch  Right;Left;2 reps;30 seconds    Quad Stretch  Right;Left;20 seconds;4 reps   seated x 1; prone x 3 reps on LLE   Hip Flexor Stretch  Right;Left;1 rep    Theme park managerGastroc Stretch  --   shown demo for HEP     Knee/Hip Exercises: Aerobic   Nustep  L4-3 x 5 min     Other Aerobic  lap around gym in between exercise, to decrease stiffness x 2 laps.       Knee/Hip Exercises: Supine   Quad Sets  Strengthening;Left;1 set;5 reps   5 sec holds    Bridges  Both;5 reps    Straight Leg Raises  Strengthening;Right;Left;1 set;5 reps      Knee/Hip Exercises: Sidelying   Hip ABduction  Strengthening;Left;1 set;10 reps      Manual Therapy   Manual Therapy  Taping    Manual therapy comments  I strip of reg [place] tape applied to med/ lateral Lt  knee with 25% stretch (creating horseshoe shape) to increase proprioception, decrease pain and decompress tissue.               PT Education - 09/28/18 1134    Education Details  ktape info; HEP    Person(s) Educated  Patient    Methods  Explanation;Handout;Demonstration;Verbal cues;Tactile cues    Comprehension  Verbalized understanding;Returned demonstration          PT Long Term Goals - 09/24/18 1208      PT LONG TERM GOAL #1   Title  independent with HEP    Status  New    Target Date  11/05/18      PT LONG TERM GOAL #2   Title  FOTO score improved to </= 28% limited for improved function    Status  New    Target Date  11/05/18      PT LONG TERM GOAL #3   Title  report ability to walk at least 25-30 min without increase in pain for improved function    Status  New    Target Date  11/05/18      PT LONG TERM GOAL #4   Title  negotiate stairs reciprocally (with 1 handrail or less) with pain < 4/10 for improved function and mobility    Status  New    Target Date  11/05/18      PT LONG TERM GOAL #5   Title  demonstrate at least 4/5 bil LE strength for improved function    Status  New     Target Date  11/05/18            Plan - 09/28/18 1148    Clinical Impression Statement  Pt continues with limited Lt knee ROM. Pt given encouragment throughout session to not avoid the discomfort associated with stretches. Lt knee ROM remains the same as last week.  Pt very talkative throughout session; cues to redirect to exercise. Goals are ongoing at this time.    Personal Factors and Comorbidities  Comorbidity 1    Comorbidities  OA    Examination-Activity Limitations  Squat;Sit;Stand;Locomotion Level;Stairs;Caring for Others    Examination-Participation Restrictions  Yard Work;Driving;Shop    Stability/Clinical Decision Making  Stable/Uncomplicated    Rehab Potential  Good    PT Frequency  2x / week    PT Duration  6 weeks    PT Treatment/Interventions  ADLs/Self Care Home Management;Cryotherapy;Electrical Stimulation;Iontophoresis 4mg /ml Dexamethasone;Moist Heat;Balance training;Therapeutic exercise;Therapeutic activities;Functional mobility training;Stair training;Gait training;Ultrasound;Patient/family education;Manual techniques;Vasopneumatic Device;Taping;Dry needling;Passive range of motion    PT Next Visit Plan  progress as able (add piriformis stretch and seated/supine therex), manual/modalities/taping PRN. Assess response to tape.    PT Home Exercise Plan  Access Code: [email or web address]    Consulted and Agree with Plan of Care  Patient       Patient will benefit from skilled therapeutic intervention in order to improve the following deficits and impairments:  Abnormal gait, Pain, Decreased range of motion, Impaired flexibility, Difficulty walking, Decreased mobility, Decreased strength  Visit Diagnosis: 1. Chronic pain of left knee   2. Chronic pain of right knee   3. Other abnormalities of gait and mobility   4. Muscle weakness (generalized)        Problem List Patient Active Problem List   Diagnosis Date Noted  . Primary osteoarthritis of both knees 09/13/2018  .  Left breast mass 04/04/2013  . Myalgia and myositis, unspecified 03/16/2013  . CERVICAL STRAIN 04/16/2009   Mayer CamelJennifer Carlson-Long,  PTA 09/28/18 12:47 PM  [place], [place], [address] Phone: [phone]   Fax:  [phone]  Name: Beverly Coffey MRN: [id number] Date of Birth: 1955-09-15

## 2018-09-29 ENCOUNTER — Encounter: Payer: Self-pay | Admitting: Physician Assistant

## 2018-09-30 ENCOUNTER — Encounter: Payer: BC Managed Care – PPO | Admitting: Physical Therapy

## 2018-10-05 ENCOUNTER — Encounter: Payer: BC Managed Care – PPO | Admitting: Physical Therapy

## 2018-10-06 ENCOUNTER — Encounter: Payer: Self-pay | Admitting: Physician Assistant

## 2018-10-07 ENCOUNTER — Other Ambulatory Visit: Payer: Self-pay

## 2018-10-07 ENCOUNTER — Encounter: Payer: BC Managed Care – PPO | Admitting: Physical Therapy

## 2018-10-07 ENCOUNTER — Ambulatory Visit (INDEPENDENT_AMBULATORY_CARE_PROVIDER_SITE_OTHER): Payer: BC Managed Care – PPO | Admitting: Physical Therapy

## 2018-10-07 VITALS — BP 127/76 | HR 67

## 2018-10-07 DIAGNOSIS — G8929 Other chronic pain: Secondary | ICD-10-CM | POA: Diagnosis not present

## 2018-10-07 DIAGNOSIS — R2689 Other abnormalities of gait and mobility: Secondary | ICD-10-CM | POA: Diagnosis not present

## 2018-10-07 DIAGNOSIS — M25561 Pain in right knee: Secondary | ICD-10-CM

## 2018-10-07 DIAGNOSIS — M25562 Pain in left knee: Secondary | ICD-10-CM

## 2018-10-07 NOTE — Patient Instructions (Signed)
Heel Slide    Bend knee and pull heel toward buttocks. Hold _5___ seconds. Return. Repeat with other knee. Repeat _10___ times. Do __1__ sessions per day.Use strap to help pull knee towards buttocks.

## 2018-10-07 NOTE — Therapy (Addendum)
[facility] [place], [place], [address] Phone: [phone]   Fax:  [phone]  Physical Therapy Treatment/Discharge Summary  Patient Details  Name: Beverly Coffey MRN: [id number] Date of Birth: [birth year] Referring Provider (PT): Silverio Decamp, MD   Encounter Date: 10/07/2018  PT End of Session - 10/07/18 0939    Visit Number  3    Number of Visits  12    Date for PT Re-Evaluation  11/05/18    PT Start Time  0936   pt arrived late   PT Stop Time  1015    PT Time Calculation (min)  39 min       Past Medical History:  Diagnosis Date  . Anxiety   . Bilateral chronic knee pain   . Vitamin D deficiency     Past Surgical History:  Procedure Laterality Date  . KNEE ARTHROSCOPY W/ MENISCAL REPAIR Left     Vitals:   10/07/18 0941  BP: 127/76  Pulse: 67    Subjective Assessment - 10/07/18 0942    Subjective  Pt reports she completed her exercises yesterday and this morning her knees are very sore.  She states she is doing HEP 2x / wk.  She would like to come 1x every 2 wks due to high co-pay.   Pt requests her BP to be checked; she was dizzy yesterday and is worried about her BP.    How long can you walk comfortably?  10-15 min (walks 25 min daily)    Patient Stated Goals  improve pain    Currently in Pain?  Yes    Pain Score  8     Pain Location  Knee    Pain Orientation  Left;Right    Pain Descriptors / Indicators  Aching;Sharp    Aggravating Factors   walking, stairs    Pain Relieving Factors  medicine         [facility] PT Assessment - 10/07/18 0001      Assessment   Medical Diagnosis  M17.0 (ICD-10-CM) - Primary osteoarthritis of both knees    Referring Provider (PT)  Silverio Decamp, MD    Onset Date/Surgical Date  --   7 years; worsening x 2 years   Hand Dominance  Right    Next MD Visit  10/11/18    Prior Therapy  following meniscus repair      AROM   Right Knee  Extension  -5    Right Knee Flexion  108    Left Knee Extension  -7    Left Knee Flexion  87       [facility] Adult PT Treatment/Exercise - 10/07/18 0001      Self-Care   Self-Care  Other Self-Care Comments    Other Self-Care Comments   pt educated on self massage with roller stick to LE to decrease sensitivity, fascial tightness, and improve ROM; pt returned demo with cues.       Knee/Hip Exercises: Stretches   Passive Hamstring Stretch  Right;Left;2 reps;30 seconds    Quad Stretch  Right;Left;20 seconds;4 reps   seated x 2 reps    Hip Flexor Stretch  Right;Left;1 rep;30 seconds    Gastroc Stretch  Right;Left;2 reps;20 seconds      Knee/Hip Exercises: Aerobic   Nustep  L3: 4 min       Knee/Hip Exercises: Supine   Heel Slides  AAROM;Left;1 set;10 reps    [employer] reps  Manual Therapy   Manual therapy comments  I strip of reg KT tape applied to med/ lateral Lt knee with 25% stretch (creating horseshoe shape) to increase proprioception, decrease pain and decompress tissue.               PT Education - 10/07/18 1326    Education Details  HEP - added assisted heel slides for Lt knee ROM; reviewed freq of exercises (DAILY stretches, 3-4x/wk strengthening) and importance of compliance to see improvements.      Person(s) Educated  Patient    Methods  Explanation;Handout;Demonstration;Tactile cues;Verbal cues    Comprehension  Verbalized understanding;Verbal cues required;Returned demonstration;Tactile cues required          PT Long Term Goals - 10/07/18 1000      PT LONG TERM GOAL #1   Title  independent with HEP    Status  On-going      PT LONG TERM GOAL #2   Title  FOTO score improved to </= 28% limited for improved function    Status  On-going      PT LONG TERM GOAL #3   Title  report ability to walk at least 25-30 min without increase in pain for improved function    Baseline  able to walk 10 min prior to increase in pain. (but walks 20-30 min with dog  with pain of 8/10)    Status  On-going      PT LONG TERM GOAL #4   Title  negotiate stairs reciprocally (with 1 handrail or less) with pain < 4/10 for improved function and mobility    Status  On-going      PT LONG TERM GOAL #5   Title  demonstrate at least 4/5 bil LE strength for improved function    Status  On-going            Plan - 10/07/18 0945    Clinical Impression Statement  Pt continues with limited knee ROM.  Pt has had limited attendance and is reporting limited compliance with HEP, likely due to discomfort associated with stretches and post exercise soreness.   Time spent educating pt on the benefits of exercises and parameters to engage in them, as well as the expected initial soreness following them.  Goals are ongoing at this time.    Personal Factors and Comorbidities  Comorbidity 1    Comorbidities  OA    Examination-Activity Limitations  Squat;Sit;Stand;Locomotion Level;Stairs;Caring for Others    Examination-Participation Restrictions  Yard Work;Driving;Shop    Stability/Clinical Decision Making  Stable/Uncomplicated    Rehab Potential  Good    PT Frequency  2x / week    PT Duration  6 weeks    PT Treatment/Interventions  ADLs/Self Care Home Management;Cryotherapy;Electrical Stimulation;Iontophoresis 34m/ml Dexamethasone;Moist Heat;Balance training;Therapeutic exercise;Therapeutic activities;Functional mobility training;Stair training;Gait training;Ultrasound;Patient/family education;Manual techniques;Vasopneumatic Device;Taping;Dry needling;Passive range of motion    PT Next Visit Plan Teach pt how to self tape.  Continue progressive ROM/strengthening.    PT Home Exercise Plan  Access Code: [email or web address]    [email or web address] Agree with Plan of Care  Patient       Patient will benefit from skilled therapeutic intervention in order to improve the following deficits and impairments:  Abnormal gait, Pain, Decreased range of motion, Impaired flexibility, Difficulty walking,  Decreased mobility, Decreased strength  Visit Diagnosis: Chronic pain of left knee  Chronic pain of right knee  Other abnormalities of gait and mobility     Problem List Patient Active Problem List  Diagnosis Date Noted  . Primary osteoarthritis of both knees 09/13/2018  . Left breast mass 04/04/2013  . Myalgia and myositis, unspecified 03/16/2013  . CERVICAL STRAIN 04/16/2009   Kerin Perna, PTA 10/07/18 1:26 PM  [facility] [place], [place], [address] Phone: [phone]   Fax:  [phone]  Name: Riya Huxford MRN: [id number] Date of Birth: 1955/02/24     PHYSICAL THERAPY DISCHARGE SUMMARY  Visits from Start of Care: 3  Current functional level related to goals / functional outcomes: See above   Remaining deficits: See above   Education / Equipment: HEP  Plan: Patient agrees to discharge.  Patient goals were not met. Patient is being discharged due to financial reasons.  ?????    Laureen Abrahams, PT, DPT 10/20/18 10:42 AM  [facility], [place] [address]  [phone] (office) [phone] (fax)

## 2018-10-11 ENCOUNTER — Encounter: Payer: Self-pay | Admitting: Sports Medicine

## 2018-10-11 ENCOUNTER — Other Ambulatory Visit: Payer: Self-pay

## 2018-10-11 ENCOUNTER — Ambulatory Visit (INDEPENDENT_AMBULATORY_CARE_PROVIDER_SITE_OTHER): Payer: BC Managed Care – PPO | Admitting: Sports Medicine

## 2018-10-11 DIAGNOSIS — M170 Bilateral primary osteoarthritis of knee: Secondary | ICD-10-CM

## 2018-10-11 NOTE — Progress Notes (Signed)
Subjective:    CC: Bilateral knee pain  HPI: This is a pleasant 63 year old female, she has known bilateral knee osteoarthritis, pain is moderate, persistent, localized at the medial joint lines without radiation, she failed conservative measures.  I reviewed the past medical history, family history, social history, surgical history, and allergies today and no changes were needed.  Please see the problem list section below in epic for further details.  Past Medical History: Past Medical History:  Diagnosis Date  . Anxiety   . Bilateral chronic knee pain   . Vitamin D deficiency    Past Surgical History: Past Surgical History:  Procedure Laterality Date  . KNEE ARTHROSCOPY W/ MENISCAL REPAIR Left    Social History: Social History   Socioeconomic History  . Marital status: Married    Spouse name: Not on file  . Number of children: Not on file  . Years of education: Not on file  . Highest education level: Not on file  Occupational History  . Not on file  Social Needs  . Financial resource strain: Not on file  . Food insecurity    Worry: Not on file    Inability: Not on file  . Transportation needs    Medical: Not on file    Non-medical: Not on file  Tobacco Use  . Smoking status: Never Smoker  . Smokeless tobacco: Never Used  Substance and Sexual Activity  . Alcohol use: No  . Drug use: Never  . Sexual activity: Yes    Birth control/protection: Post-menopausal  Lifestyle  . Physical activity    Days per week: Not on file    Minutes per session: Not on file  . Stress: Not on file  Relationships  . Social Musicianconnections    Talks on phone: Not on file    Gets together: Not on file    Attends religious service: Not on file    Active member of club or organization: Not on file    Attends meetings of clubs or organizations: Not on file    Relationship status: Not on file  Other Topics Concern  . Not on file  Social History Narrative  . Not on file   Family  History: Family History  Problem Relation Age of Onset  . Diabetes Mother   . Heart attack Mother   . Hypertension Mother   . Rheum arthritis Father   . Parkinson's disease Father   . Prostate cancer Brother    Allergies: No Known Allergies Medications: See med rec.  Review of Systems: No fevers, chills, night sweats, weight loss, chest pain, or shortness of breath.   Objective:    General: Well Developed, well nourished, and in no acute distress.  Neuro: Alert and oriented x3, extra-ocular muscles intact, sensation grossly intact.  HEENT: Normocephalic, atraumatic, pupils equal round reactive to light, neck supple, no masses, no lymphadenopathy, thyroid nonpalpable.  Skin: Warm and dry, no rashes. Cardiac: Regular rate and rhythm, no murmurs rubs or gallops, no lower extremity edema.  Respiratory: Clear to auscultation bilaterally. Not using accessory muscles, speaking in full sentences. Bilateral knees: Varus deformity, tender at the medial joint lines ROM normal in flexion and extension and lower leg rotation. Ligaments with solid consistent endpoints including ACL, PCL, LCL, MCL. Negative Mcmurray's and provocative meniscal tests. Non painful patellar compression. Patellar and quadriceps tendons unremarkable. Hamstring and quadriceps strength is normal.  Procedure: Real-time Ultrasound Guided injection of the left knee Device: GE Logiq E  Verbal informed consent obtained.  Time-out conducted.  Noted no overlying erythema, induration, or other signs of local infection.  Skin prepped in a sterile fashion.  Local anesthesia: Topical Ethyl chloride.  With sterile technique and under real time ultrasound guidance:  1 cc Kenalog 40, 2 cc lidocaine, 2 cc bupivacaine injected easily Completed without difficulty  Pain immediately resolved suggesting accurate placement of the medication.  Advised to call if fevers/chills, erythema, induration, drainage, or persistent bleeding.   Images permanently stored and available for review in the ultrasound unit.  Impression: Technically successful ultrasound guided injection.  Procedure: Real-time Ultrasound Guided injection of the right knee Device: GE Logiq E  Verbal informed consent obtained.  Time-out conducted.  Noted no overlying erythema, induration, or other signs of local infection.  Skin prepped in a sterile fashion.  Local anesthesia: Topical Ethyl chloride.  With sterile technique and under real time ultrasound guidance:  1 cc Kenalog 40, 2 cc lidocaine, 2 cc bupivacaine injected easily Completed without difficulty  Pain immediately resolved suggesting accurate placement of the medication.  Advised to call if fevers/chills, erythema, induration, drainage, or persistent bleeding.  Images permanently stored and available for review in the ultrasound unit.  Impression: Technically successful ultrasound guided injection.  Impression and Recommendations:    Primary osteoarthritis of both knees Did not respond to conservative treatment, bilateral knee injections today. Continue home physical therapy. Return to see me in 4 weeks, viscosupplementation if no better.   ___________________________________________ Gwen Her. Dianah Field, M.D., ABFM., CAQSM. Primary Care and Sports Medicine [facility]  Adjunct Professor of [place] [employer] of Medicine

## 2018-10-11 NOTE — Assessment & Plan Note (Signed)
Did not respond to conservative treatment, bilateral knee injections today. Continue home physical therapy. Return to see me in 4 weeks, viscosupplementation if no better.

## 2018-10-14 ENCOUNTER — Encounter: Payer: Self-pay | Admitting: Physician Assistant

## 2018-10-19 ENCOUNTER — Ambulatory Visit (INDEPENDENT_AMBULATORY_CARE_PROVIDER_SITE_OTHER): Payer: BC Managed Care – PPO | Admitting: Physician Assistant

## 2018-10-19 ENCOUNTER — Other Ambulatory Visit: Payer: Self-pay

## 2018-10-19 ENCOUNTER — Encounter: Payer: BC Managed Care – PPO | Admitting: Physical Therapy

## 2018-10-19 VITALS — BP 128/77 | HR 74 | Ht 61.0 in | Wt 170.0 lb

## 2018-10-19 DIAGNOSIS — Z1211 Encounter for screening for malignant neoplasm of colon: Secondary | ICD-10-CM

## 2018-10-19 DIAGNOSIS — R030 Elevated blood-pressure reading, without diagnosis of hypertension: Secondary | ICD-10-CM | POA: Diagnosis not present

## 2018-10-19 DIAGNOSIS — Z23 Encounter for immunization: Secondary | ICD-10-CM

## 2018-10-19 NOTE — Progress Notes (Signed)
Patient presents to office for blood pressure check. Last office reading was 130/76. Patient has not checked any blood pressures at home, did not get machine. Denies any headaches or feelings of heart racing. Feels blood pressure has been well.   Vitals:   10/19/18 1112  BP: 128/77  Pulse: 74  SpO2: 97%   BP Readings from Last 3 Encounters:  11/09/18 118/72  10/19/18 128/77  10/11/18 130/76    Patient advised to get BP machine and check 1-2 times weekly for a month, then bring in readings and machine for nurse visit.   Patient received flu shot today. No complications.   Per pt request, referral placed for coloscopy, she decided against Cologuard. Please review and sign

## 2018-11-09 ENCOUNTER — Encounter: Payer: Self-pay | Admitting: Sports Medicine

## 2018-11-09 ENCOUNTER — Other Ambulatory Visit: Payer: Self-pay

## 2018-11-09 ENCOUNTER — Ambulatory Visit (INDEPENDENT_AMBULATORY_CARE_PROVIDER_SITE_OTHER): Payer: BC Managed Care – PPO | Admitting: Sports Medicine

## 2018-11-09 DIAGNOSIS — M170 Bilateral primary osteoarthritis of knee: Secondary | ICD-10-CM

## 2018-11-09 NOTE — Assessment & Plan Note (Signed)
Severe arthritis on x-rays, bilateral injections only provided meager relief. Physical therapy was a bit too expensive to do more than a couple of sessions. At this point we are going to get her approved for Visco supplementation. Return to start injections.

## 2018-11-09 NOTE — Progress Notes (Signed)
Subjective:    CC: Follow-up  HPI: This is a pleasant 63 year old female, she has known bilateral knee osteoarthritis, we have given meloxicam, we did bilateral injections, she returns today with only minimal improvement in her pain, moderate, persistent, localized to the medial joint lines without radiation, no trauma, no mechanical symptoms.  I reviewed the past medical history, family history, social history, surgical history, and allergies today and no changes were needed.  Please see the problem list section below in epic for further details.  Past Medical History: Past Medical History:  Diagnosis Date  . Anxiety   . Bilateral chronic knee pain   . Vitamin D deficiency    Past Surgical History: Past Surgical History:  Procedure Laterality Date  . KNEE ARTHROSCOPY W/ MENISCAL REPAIR Left    Social History: Social History   Socioeconomic History  . Marital status: Married    Spouse name: Not on file  . Number of children: Not on file  . Years of education: Not on file  . Highest education level: Not on file  Occupational History  . Not on file  Social Needs  . Financial resource strain: Not on file  . Food insecurity    Worry: Not on file    Inability: Not on file  . Transportation needs    Medical: Not on file    Non-medical: Not on file  Tobacco Use  . Smoking status: Never Smoker  . Smokeless tobacco: Never Used  Substance and Sexual Activity  . Alcohol use: No  . Drug use: Never  . Sexual activity: Yes    Birth control/protection: Post-menopausal  Lifestyle  . Physical activity    Days per week: Not on file    Minutes per session: Not on file  . Stress: Not on file  Relationships  . Social Herbalist on phone: Not on file    Gets together: Not on file    Attends religious service: Not on file    Active member of club or organization: Not on file    Attends meetings of clubs or organizations: Not on file    Relationship status: Not on file   Other Topics Concern  . Not on file  Social History Narrative  . Not on file   Family History: Family History  Problem Relation Age of Onset  . Diabetes Mother   . Heart attack Mother   . Hypertension Mother   . Rheum arthritis Father   . Parkinson's disease Father   . Prostate cancer Brother    Allergies: No Known Allergies Medications: See med rec.  Review of Systems: No fevers, chills, night sweats, weight loss, chest pain, or shortness of breath.   Objective:    General: Well Developed, well nourished, and in no acute distress.  Neuro: Alert and oriented x3, extra-ocular muscles intact, sensation grossly intact.  HEENT: Normocephalic, atraumatic, pupils equal round reactive to light, neck supple, no masses, no lymphadenopathy, thyroid nonpalpable.  Skin: Warm and dry, no rashes. Cardiac: Regular rate and rhythm, no murmurs rubs or gallops, no lower extremity edema.  Respiratory: Clear to auscultation bilaterally. Not using accessory muscles, speaking in full sentences.  Impression and Recommendations:    Primary osteoarthritis of both knees Severe arthritis on x-rays, bilateral injections only provided meager relief. Physical therapy was a bit too expensive to do more than a couple of sessions. At this point we are going to get her approved for Visco supplementation. Return to start injections.  ___________________________________________ Gwen Her. Dianah Field, M.D., ABFM., CAQSM. Primary Care and Sports Medicine [facility]  Adjunct Professor of [place] [employer] [facility] of Medicine

## 2018-11-11 ENCOUNTER — Encounter: Payer: Self-pay | Admitting: Gastroenterology

## 2018-11-18 ENCOUNTER — Telehealth: Payer: Self-pay | Admitting: Physician Assistant

## 2018-11-18 ENCOUNTER — Emergency Department (INDEPENDENT_AMBULATORY_CARE_PROVIDER_SITE_OTHER)
Admission: EM | Admit: 2018-11-18 | Discharge: 2018-11-18 | Disposition: A | Discharge status: Home / Self Care | Payer: BC Managed Care – PPO | Source: Home / Self Care

## 2018-11-18 ENCOUNTER — Other Ambulatory Visit: Payer: Self-pay

## 2018-11-18 DIAGNOSIS — Z20828 Contact with and (suspected) exposure to other viral communicable diseases: Secondary | ICD-10-CM | POA: Diagnosis not present

## 2018-11-18 DIAGNOSIS — Z20822 Contact with and (suspected) exposure to covid-19: Secondary | ICD-10-CM

## 2018-11-18 NOTE — Discharge Instructions (Signed)
°  Due to concern for possibly having Covid-19, it is advised that you self-isolate at home until test results come back.  If positive, it is recommended you stay isolated for at least 10 days after symptom onset and 24 hours after last fever without taking medication (whichever is longer).  If you MUST go out, please wear a mask at all times, limit contact with others.   Most results have been coming back within about 2-6 days.   If your results are negative, you will NOT be receiving a phone call. You may check your MyChart account, please see in this packet how to set on up if you do not already have one. There is also an app for phones you can download.   You WILL be notified for POSITIVE results.

## 2018-11-18 NOTE — ED Triage Notes (Signed)
Was exposed to granddaughter last Friday and granddaughter tested positive last Sunday.

## 2018-11-18 NOTE — ED Provider Notes (Signed)
[facility]    CSN: [id number] Arrival date & time: 11/18/18  1757      History   Chief Complaint Chief Complaint  Patient presents with  . COVID exposure    HPI Beverly Coffey is a 63 y.o. female.   HPI Beverly C[facility] is a 63 y.o. female presenting to UC with request for Covid-19 testing.  Pt states her 15yo granddaughter was at her house last Friday, c/o a HA at that time but no other symptoms. The granddaughter and her father were both tested on Sunday, 11/14/2018, for Covid--19, those tests have since come back Positive. 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    Past Medical History:  Diagnosis Date  . Anxiety   . Bilateral chronic knee pain   . Vitamin D deficiency     Patient Active Problem List   Diagnosis Date Noted  . Primary osteoarthritis of both knees 09/13/2018  . Left breast mass 04/04/2013  . Myalgia and myositis, unspecified 03/16/2013  . CERVICAL STRAIN 04/16/2009    Past Surgical History:  Procedure Laterality Date  . KNEE ARTHROSCOPY W/ MENISCAL REPAIR Left     OB History    Gravida  3   Para  2   Term      Preterm      AB  1   Living  2     SAB  1   TAB      Ectopic      Multiple      Live Births               Home Medications    Prior to Admission medications   Medication Sig Start Date End Date Taking? Authorizing Provider  meloxicam (MOBIC) 15 MG tablet One tab PO qAM with breakfast for 2 weeks, then daily prn pain. 09/13/18   Monica Becton, MD  Multiple Vitamins-Minerals (MULTIVITAMIN WITH MINERALS) tablet Take 1 tablet by mouth daily.    [provider]    Family History Family History  Problem Relation Age of Onset  . Diabetes Mother   . Heart attack Mother   . Hypertension Mother   . Rheum arthritis Father   . Parkinson's disease Father   . Prostate cancer Brother     Social History Social History   Tobacco Use  . Smoking status: Never Smoker  . Smokeless tobacco: Never Used  Substance Use Topics  . Alcohol use: No  . Drug use: Never     Allergies   Patient has no known allergies.   Review of Systems Review of Systems  Constitutional: Negative for chills and fever.  HENT: Negative for congestion and sore throat.   Respiratory: Negative for cough and shortness of breath.   Gastrointestinal: Negative for diarrhea, nausea and vomiting.  Neurological: Negative for dizziness and headaches.     Physical Exam Triage Vital Signs ED Triage Vitals  Enc Vitals Group     BP 11/18/18 1816 123/76     Pulse Rate 11/18/18 1816 76     Resp 11/18/18 1816 20     Temp 11/18/18 1816 97.6 F (36.4 C)     Temp Source 11/18/18 1816 Oral     SpO2 11/18/18 1816 98 %     Weight 11/18/18 1811 156 lb (70.8 kg)     Height 11/18/18 1811 5\' 1"  (1.549  m)     Head Circumference --      Peak Flow --      Pain Score 11/18/18 1811 0     Pain Loc --      Pain Edu? --      Excl. in GC? --    No data found.  Updated Vital Signs BP 123/76 (BP Location: Right Arm)   Pulse 76   Temp 97.6 F (36.4 C) (Oral)   Resp 20   Ht 5\' 1"  (1.549 m)   Wt 156 lb (70.8 kg)   SpO2 98%   BMI 29.48 kg/m   Visual Acuity Right Eye Distance:   Left Eye Distance:   Bilateral Distance:    Right Eye Near:   Left Eye Near:    Bilateral Near:     Physical Exam Vitals signs and nursing note reviewed.  Constitutional:      Appearance: Normal appearance. She is well-developed.  HENT:     Head: Normocephalic and atraumatic.     Right Ear: Tympanic membrane normal.     Left Ear: Tympanic membrane normal.     Nose: Nose normal.     Mouth/Throat:     Lips: Pink.     Mouth: Mucous membranes are moist.     Pharynx: Oropharynx is clear. Uvula midline.  Neck:     Musculoskeletal: Normal range of motion.  Cardiovascular:     Rate and Rhythm: Normal rate and regular rhythm.   Pulmonary:     Effort: Pulmonary effort is normal. No respiratory distress.     Breath sounds: Normal breath sounds. No stridor. No wheezing, rhonchi or rales.  Musculoskeletal: Normal range of motion.  Skin:    General: Skin is warm and dry.  Neurological:     Mental Status: She is alert and oriented to person, place, and time.  Psychiatric:        Behavior: Behavior normal.      UC Treatments / Results  Labs (all labs ordered are listed, but only abnormal results are displayed) Labs Reviewed  SARS-COV-2 RNA, QUALITATIVE REAL-TIME RT-PCR    EKG   Radiology No results found.  Procedures Procedures (including critical care time)  Medications Ordered in UC Medications - No data to display  Initial Impression / Assessment and Plan / UC Course  I have reviewed the triage vital signs and the nursing notes.  Pertinent labs & imaging results that were available during my care of the patient were reviewed by me and considered in my medical decision making (see chart for details).     Normal exam. Covid test pending AVS provided  Final Clinical Impressions(s) / UC Diagnoses   Final diagnoses:  Exposure to COVID-19 virus     Discharge Instructions      Due to concern for possibly having Covid-19, it is advised that you self-isolate at home until test results come back.  If positive, it is recommended you stay isolated for at least 10 days after symptom onset and 24 hours after last fever without taking medication (whichever is longer).  If you MUST go out, please wear a mask at all times, limit contact with others.   Most results have been coming back within about 2-6 days.   If your results are negative, you will NOT be receiving a phone call. You may check your MyChart account, please see in this packet how to set on up if you do not already have one. There is also an app for  phones you can download.   You WILL be notified for POSITIVE results.      ED  Prescriptions    None     PDMP not reviewed this encounter.   Noe Gens, [place] 11/18/18 [phone]

## 2018-11-18 NOTE — Telephone Encounter (Signed)
Patient wants to come in to get a covid test. Not having any symptoms and she hasn't been around anyone with covid. Please advise.

## 2018-11-19 NOTE — Telephone Encounter (Signed)
Do we know why? Routing to triage.  I'm ok to double book ONLY IF she is ONLY needing COVID swab and NOTHING ELSE would still need separate visit to establish

## 2018-11-19 NOTE — Telephone Encounter (Signed)
Patient had the COVID-19 test last night. She will call to schedule an establish.

## 2018-11-20 LAB — SARS-COV-2 RNA,(COVID-19) QUALITATIVE NAAT: SARS CoV2 RNA: NOT DETECTED

## 2018-12-01 ENCOUNTER — Telehealth: Payer: Self-pay | Admitting: Sports Medicine

## 2018-12-01 NOTE — Telephone Encounter (Signed)
I have called the patient to let her know that I am sending information for the knee injections that are covered. I have sent to denial letter to scan. Patient is agreeable to go with the covered knee injections. She did not have any other questions.

## 2018-12-01 NOTE — Telephone Encounter (Signed)
-----   Message from Tasia Catchings, [place] sent at 12/01/2018 11:42 AM EDT ----- Received a fax from insurance that Synvisc is not preferred and the preferred injections are Euflexxa and Gelsyn-3.  ----- Message ----- From: Tasia Catchings, CMA Sent: 11/25/2018   3:02 PM EDT To: Tasia Catchings, CMA  I called to check the status of the injections and per Mariann Laster the clinical department needed additional information. The clinical department will fax the response within 3-5 business days.  ----- Message ----- From: Tasia Catchings, CMA Sent: 11/09/2018   4:07 PM EDT To: Tasia Catchings, CMA  Per patient insurance the preferred is Synvisc. I am waiting on the insurance approval. ----- Message ----- From: Silverio Decamp, MD Sent: 11/09/2018  11:13 AM EDT To: Beatris Ship Avaleen Brownley, CMA  Orthovisc approval please, both knees, x-ray confirmed, failed steroid injections ___________________________________________Thomas J. Dianah Field, M.D., ABFM., CAQSM.Primary Care and Sports MedicineC[facility] MedCenter KernersvilleAdjunct Professor of [place] of [facility] of Medicine

## 2018-12-03 MED ORDER — GELSYN-3 16.8 MG/2ML IX SOSY: 1.0000 | PREFILLED_SYRINGE | INTRA_ARTICULAR | 3 refills | Status: DC

## 2018-12-03 NOTE — Telephone Encounter (Signed)
I received an approval for the Gelsyn-3 injections. Can you please send a prescription for the injections for both knees to [facility] pharmacy. Thanks

## 2018-12-03 NOTE — Telephone Encounter (Signed)
Done

## 2018-12-07 ENCOUNTER — Ambulatory Visit: Payer: BC Managed Care – PPO | Admitting: Osteopathic Medicine

## 2018-12-28 ENCOUNTER — Encounter: Payer: BC Managed Care – PPO | Admitting: Gastroenterology

## 2018-12-28 NOTE — Telephone Encounter (Signed)
I spoke with Sharee Pimple at [facility] and she will send this as a high priority to medical benefits team to review today and call the patient.

## 2019-01-03 NOTE — Telephone Encounter (Signed)
I called the patient to let her know that the pharmacy is still working on this prescription and we will let her know when I have more information. She did not have any questions.

## 2019-01-04 NOTE — Telephone Encounter (Signed)
Called [facility] and spoke with [facility] C[facility] and she shows an order. It has not been processed. I was placed on hold and the call hung up. I will try to call another time.

## 2019-01-12 MED ORDER — GELSYN-3 16.8 MG/2ML IX SOSY: 1.0000 | PREFILLED_SYRINGE | INTRA_ARTICULAR | 3 refills | Status: DC

## 2019-01-12 MED ORDER — GELSYN-3 16.8 MG/2ML IX SOSY
1.0000 | PREFILLED_SYRINGE | INTRA_ARTICULAR | 3 refills | Status: DC
Start: 2019-01-12 — End: 2022-04-28

## 2019-01-12 NOTE — Telephone Encounter (Signed)
It actually needs to go to [facility]. Pended the medication with [place]. Cancelled prescription at [facility].

## 2019-01-12 NOTE — Telephone Encounter (Signed)
I have sent another PA request and per [facility],   This medication currently does require a prior authorization, therefore you may fill this medication at a [facility] or [facility] located near you. Please review the Benefit Plan Availability Tool on [email or web address] to see what pharmacies are available in your area.

## 2019-01-12 NOTE — Telephone Encounter (Signed)
Sent this to [facility].

## 2019-01-12 NOTE — Telephone Encounter (Signed)
Patient is ok with getting the medication from [facility] and did not have any concerns.    Can you send this to [facility] They can get this in and can run this for the patient. Please advise.

## 2019-01-27 NOTE — Telephone Encounter (Signed)
I called to check the status of the knee injections and I was hold for 20 minutes and then the line was disconnected. I will try again later.

## 2019-02-02 NOTE — Telephone Encounter (Signed)
I called [facility] and spoke with Delrae Alfred and he is going to finalize [facility] and shipping information. I have also called the patient to let her know that we are still working on this and the process is being finalized to get the injections shipped to the office. No other questions at this time.

## 2019-03-11 NOTE — Telephone Encounter (Signed)
[facility] pharmacy and per Rosalita Chessman the injections will need a new PA. I have submitted to insurance and waiting on a response.

## 2019-03-21 NOTE — Telephone Encounter (Signed)
Insurance faxed a form needing additional information. Waiting on a response.

## 2019-04-01 NOTE — Telephone Encounter (Signed)
I did not receive anything from insurance and I have re-submitted the information to insurance.

## 2019-04-04 NOTE — Telephone Encounter (Signed)
Appro[place] February 19 Request Reference Number: [id number]. GELSYN-3 INJ 16.8/2ML is approved through 04/22/2019. Your patient may now fill this prescription and it will be covered. Per Idell Pickles the injections are approved and are around 400. She is going to reach out to insurance and see about some patient assistance and let the patient know. Patient is aware and did not have any questions.

## 2021-06-15 IMAGING — DX RIGHT KNEE - COMPLETE 4+ VIEW
3 series · 3 of 3 positions shown · non-contrast
Comparison: None.

CLINICAL DATA: Chronic bilateral knee pain. Medial joint line
tenderness. History of meniscus repair.

EXAM:
LEFT KNEE - COMPLETE 4+ VIEW; RIGHT KNEE - COMPLETE 4+ VIEW

[tunnel]
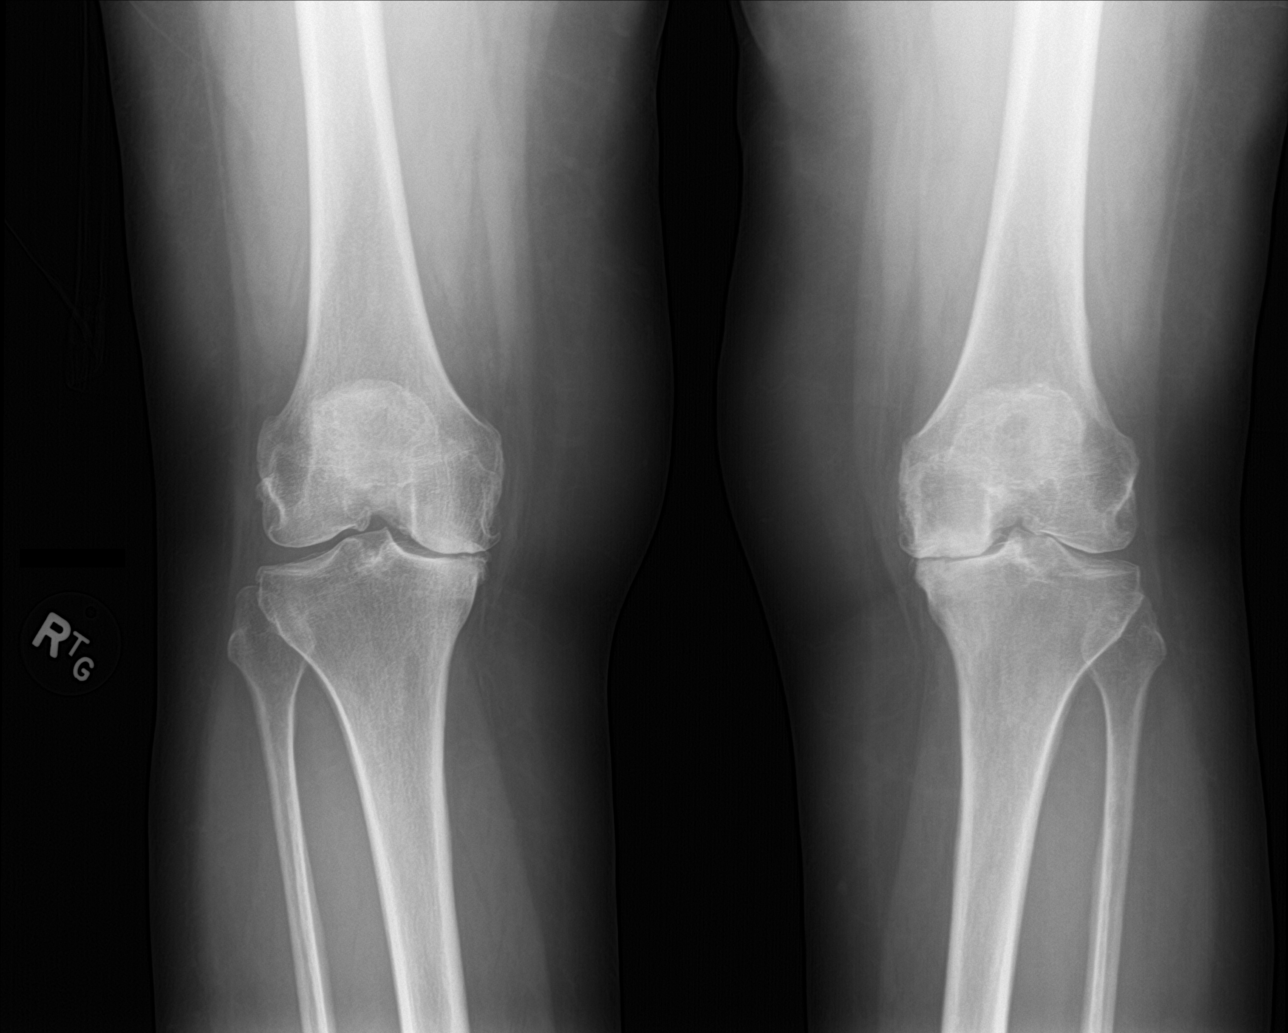

[knee lat]
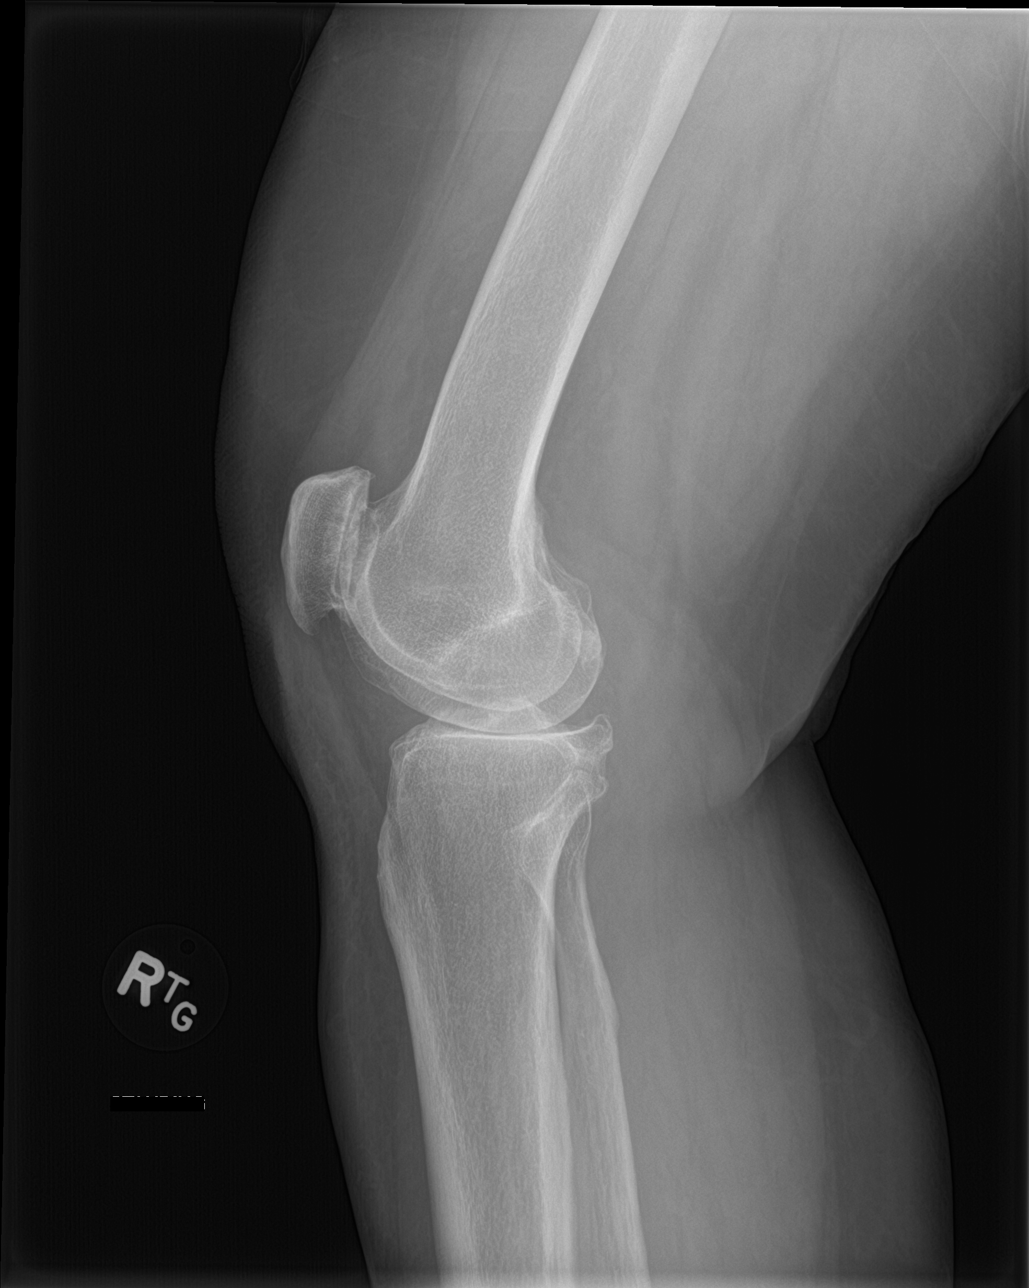

[knee sunrise]
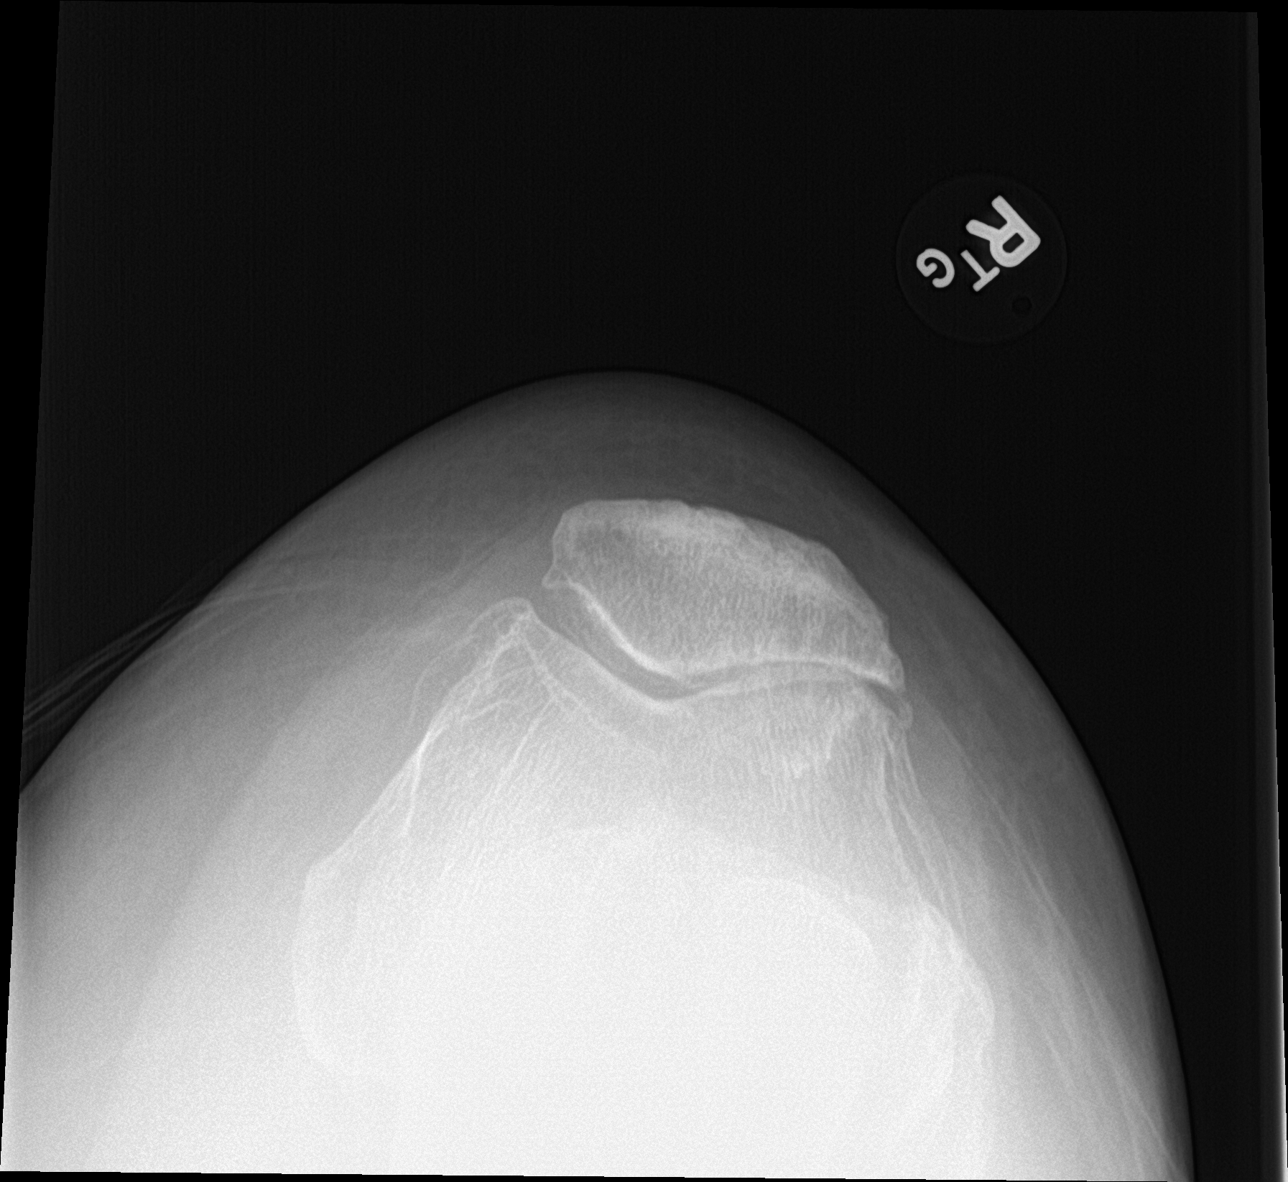

[3 of 3 positions shown; findings below may reference images not displayed]

FINDINGS: No acute fracture or dislocation is identified. There are at most
small knee joint effusions bilaterally. Medial compartment joint
space narrowing is severe on the left and moderate to severe on the
right with associated marginal osteophytosis. Mild lateral
compartment marginal osteophyte formation is noted bilaterally.
Moderate to severe patellofemoral osteoarthrosis is noted
bilaterally with joint space narrowing being worse on the right and
marginal osteophytosis being worse on the left. The soft tissues are
unremarkable.
IMPRESSION: Severe bilateral knee osteoarthrosis.

## 2022-01-09 ENCOUNTER — Ambulatory Visit: Payer: BC Managed Care – PPO | Admitting: Family Medicine

## 2022-04-28 ENCOUNTER — Ambulatory Visit (INDEPENDENT_AMBULATORY_CARE_PROVIDER_SITE_OTHER): Payer: BC Managed Care – PPO | Admitting: Family Medicine

## 2022-04-28 ENCOUNTER — Encounter: Payer: Self-pay | Admitting: Family Medicine

## 2022-04-28 VITALS — BP 132/73 | HR 73 | Ht 61.0 in | Wt 170.0 lb

## 2022-04-28 DIAGNOSIS — Z1211 Encounter for screening for malignant neoplasm of colon: Secondary | ICD-10-CM

## 2022-04-28 DIAGNOSIS — E782 Mixed hyperlipidemia: Secondary | ICD-10-CM | POA: Diagnosis not present

## 2022-04-28 DIAGNOSIS — Z23 Encounter for immunization: Secondary | ICD-10-CM

## 2022-04-28 DIAGNOSIS — M170 Bilateral primary osteoarthritis of knee: Secondary | ICD-10-CM | POA: Diagnosis not present

## 2022-04-28 DIAGNOSIS — Z0000 Encounter for general adult medical examination without abnormal findings: Secondary | ICD-10-CM

## 2022-04-28 LAB — CBC
Hemoglobin: 13.7 g/dL (ref 11.7–15.5)
MCH: 28.2 pg (ref 27.0–33.0)
MCV: 84.3 fL (ref 80.0–100.0)
RBC: 4.85 10*6/uL (ref 3.80–5.10)
RDW: 11.9 % (ref 11.0–15.0)
WBC: 6.3 10*3/uL (ref 3.8–10.8)

## 2022-04-28 NOTE — Assessment & Plan Note (Signed)
-   recommend follow up with Dr. Darene Lamer to discuss injection vs knee replacement

## 2022-04-28 NOTE — Progress Notes (Signed)
Established patient visit   Patient: Beverly Coffey   DOB: [birth year]   67 y.o. Female  MRN: [id number] Visit Date: 04/28/2022  Today's healthcare provider: Owens Loffler, DO   Chief Complaint  Patient presents with   [place]    Chief Complaint  Patient presents with   Establish Care   HPI  Pt presents as new patient.   3-4 months she has noticed a knot on her right pinky finger. She has a history of osteoarthritis and is followed by Dr. Darene Lamer. She has tried physical therapy. She is   Brother had prostate cancer   Pap smear: next August   Review of Systems  Constitutional:  Negative for activity change, fatigue and fever.  Respiratory:  Negative for cough and shortness of breath.   Cardiovascular:  Negative for chest pain.  Gastrointestinal:  Negative for abdominal pain.  Genitourinary:  Negative for difficulty urinating.  Musculoskeletal:        Knee pain bilaterally       No outpatient medications have been marked as taking for the 04/28/22 encounter (Office Visit) with Owens Loffler, DO.    OBJECTIVE    BP 132/73   Pulse 73   Ht 5\' 1"  (1.549 m)   Wt 170 lb (77.1 kg)   SpO2 100%   BMI 32.12 kg/m   Physical Exam Vitals and nursing note reviewed.  Constitutional:      General: She is not in acute distress.    Appearance: Normal appearance.  HENT:     Head: Normocephalic and atraumatic.     Right Ear: External ear normal.     Left Ear: External ear normal.     Nose: Nose normal.  Eyes:     Conjunctiva/sclera: Conjunctivae normal.  Cardiovascular:     Rate and Rhythm: Normal rate and regular rhythm.  Pulmonary:     Effort: Pulmonary effort is normal.     Breath sounds: Normal breath sounds.  Neurological:     General: No focal deficit present.     Mental Status: She is alert and oriented to person, place, and time.  Psychiatric:        Mood and Affect: Mood normal.        Behavior: Behavior normal.        Thought Content:  Thought content normal.        Judgment: Judgment normal.        ASSESSMENT & PLAN    Problem List Items Addressed This Visit       Musculoskeletal and Integument   Primary osteoarthritis of both knees    - recommend follow up with Dr. Darene Lamer to discuss injection vs knee replacement         Other   Mixed hyperlipidemia   Relevant Orders   Lipid panel   Routine adult health maintenance - Primary   Relevant Orders   Ambulatory referral to Gastroenterology   CBC   COMPLETE METABOLIC PANEL WITH GFR   TSH + free T4   Lipid panel   Other Visit Diagnoses     Colon cancer screening       Relevant Orders   Ambulatory referral to Gastroenterology   Immunization due       Relevant Orders   Tdap vaccine greater than or equal to 7yo IM (Completed)   Varicella-zoster vaccine IM (Completed)       No follow-ups on file.      No  orders of the defined types were placed in this encounter.   Orders Placed This Encounter  Procedures   Tdap vaccine greater than or equal to 7yo IM   Varicella-zoster vaccine IM   CBC   COMPLETE METABOLIC PANEL WITH GFR    Order Specific Question:   Release to patient    Answer:   Immediate    Order Specific Question:   Remote health to draw?    Answer:   No   TSH + free T4   Lipid panel   Ambulatory referral to Gastroenterology    Referral Priority:   Routine    Referral Type:   Consultation    Referral Reason:   Specialty Services Required    Referred to Provider:   [facility]    Number of Visits Requested:   [place] at [facility] [phone] (phone) [phone] (fax)  [place]

## 2022-04-29 ENCOUNTER — Telehealth: Payer: Self-pay | Admitting: Family Medicine

## 2022-04-29 LAB — LIPID PANEL
Cholesterol: 198 mg/dL (ref <200)
HDL: 56 mg/dL (ref >=50)
LDL Cholesterol (Calc): 119 mg/dL (calc) — ABNORMAL HIGH
Non-HDL Cholesterol (Calc): 142 mg/dL (calc) — ABNORMAL HIGH (ref <130)
Total CHOL/HDL Ratio: 3.5 (calc) (ref <5.0)
Triglycerides: 118 mg/dL (ref <150)

## 2022-04-29 LAB — CBC
HCT: 40.9 % (ref 35.0–45.0)
MCHC: 33.5 g/dL (ref 32.0–36.0)
MPV: 9.8 fL (ref 7.5–12.5)
Platelets: 308 10*3/uL (ref 140–400)

## 2022-04-29 LAB — COMPLETE METABOLIC PANEL WITH GFR
AG Ratio: 1.7 (calc) (ref 1.0–2.5)
ALT: 24 U/L (ref 6–29)
AST: 25 U/L (ref 10–35)
Albumin: 4.6 g/dL (ref 3.6–5.1)
Alkaline phosphatase (APISO): 79 U/L (ref 37–153)
BUN: 23 mg/dL (ref 7–25)
CO2: 26 mmol/L (ref 20–32)
Calcium: 9.3 mg/dL (ref 8.6–10.4)
Chloride: 108 mmol/L (ref 98–110)
Creat: 0.59 mg/dL (ref 0.50–1.05)
Globulin: 2.7 g/dL (calc) (ref 1.9–3.7)
Glucose, Bld: 82 mg/dL (ref 65–99)
Potassium: 4.3 mmol/L (ref 3.5–5.3)
Sodium: 143 mmol/L (ref 135–146)
Total Bilirubin: 0.5 mg/dL (ref 0.2–1.2)
Total Protein: 7.3 g/dL (ref 6.1–8.1)
eGFR: 101 mL/min/{1.73_m2} (ref >=60)

## 2022-04-29 LAB — TSH+FREE T4: TSH W/REFLEX TO FT4: 0.85 mIU/L (ref 0.40–4.50)

## 2022-04-29 NOTE — Telephone Encounter (Signed)
Patient called she is asking for paperwork to get handicap sticker daughter will come by on Wednesday 04-30-22 to pick up

## 2022-05-07 ENCOUNTER — Ambulatory Visit (INDEPENDENT_AMBULATORY_CARE_PROVIDER_SITE_OTHER): Payer: BC Managed Care – PPO | Admitting: Sports Medicine

## 2022-05-07 ENCOUNTER — Ambulatory Visit (INDEPENDENT_AMBULATORY_CARE_PROVIDER_SITE_OTHER): Payer: BC Managed Care – PPO

## 2022-05-07 ENCOUNTER — Other Ambulatory Visit: Payer: Self-pay | Admitting: Family Medicine

## 2022-05-07 DIAGNOSIS — M170 Bilateral primary osteoarthritis of knee: Secondary | ICD-10-CM | POA: Diagnosis not present

## 2022-05-07 DIAGNOSIS — M1711 Unilateral primary osteoarthritis, right knee: Secondary | ICD-10-CM | POA: Diagnosis not present

## 2022-05-07 DIAGNOSIS — M1712 Unilateral primary osteoarthritis, left knee: Secondary | ICD-10-CM | POA: Diagnosis not present

## 2022-05-07 DIAGNOSIS — M25461 Effusion, right knee: Secondary | ICD-10-CM | POA: Diagnosis not present

## 2022-05-07 DIAGNOSIS — Z1231 Encounter for screening mammogram for malignant neoplasm of breast: Secondary | ICD-10-CM

## 2022-05-07 MED ORDER — MELOXICAM 15 MG PO TABS: ORAL_TABLET | ORAL | 3 refills | Status: DC

## 2022-05-07 NOTE — Progress Notes (Signed)
    Procedures performed today:    None.  Independent interpretation of notes and tests performed by another provider:   None.  Brief History, Exam, Impression, and Recommendations:    Primary osteoarthritis of both knees This is a very pleasant 67 year old female, she is well-known to me, I last saw her back in 2020, we are treating her for knee osteoarthritis, she had steroid injections which were minimally efficacious, we try to get her approved for viscosupplementation but her insurance company would not cover this. There has been a several year hiatus, she is back with worsening knee pain bilaterally, medial joint line, severe gelling. She is not doing any NSAIDs, GFR is normal so adding meloxicam, home physical therapy and baseline x-rays, will do this for 4-6 weeks before repeating bilateral steroid injections potentially adding knee unloader bracing, if steroid injections fail we will consider viscosupplementation, if viscosupplementation fails then we will discuss arthroplasty.    ____________________________________________ Gwen Her. Dianah Field, M.D., ABFM., CAQSM., AME. Primary Care and Sports Medicine [place] [facility]  Adjunct Professor of [place] [employer] [facility] of Medicine  Risk manager

## 2022-05-07 NOTE — Assessment & Plan Note (Signed)
This is a very pleasant 67 year old female, she is well-known to me, I last saw her back in 2020, we are treating her for knee osteoarthritis, she had steroid injections which were minimally efficacious, we try to get her approved for viscosupplementation but her insurance company would not cover this. There has been a several year hiatus, she is back with worsening knee pain bilaterally, medial joint line, severe gelling. She is not doing any NSAIDs, GFR is normal so adding meloxicam, home physical therapy and baseline x-rays, will do this for 4-6 weeks before repeating bilateral steroid injections potentially adding knee unloader bracing, if steroid injections fail we will consider viscosupplementation, if viscosupplementation fails then we will discuss arthroplasty.

## 2022-06-04 ENCOUNTER — Other Ambulatory Visit (INDEPENDENT_AMBULATORY_CARE_PROVIDER_SITE_OTHER): Payer: BC Managed Care – PPO

## 2022-06-04 ENCOUNTER — Ambulatory Visit (INDEPENDENT_AMBULATORY_CARE_PROVIDER_SITE_OTHER): Payer: BC Managed Care – PPO | Admitting: Sports Medicine

## 2022-06-04 DIAGNOSIS — M170 Bilateral primary osteoarthritis of knee: Secondary | ICD-10-CM

## 2022-06-04 NOTE — Progress Notes (Signed)
    Procedures performed today:    Procedure: Real-time Ultrasound Guided injection of the left knee Device: Samsung HS60  Verbal informed consent obtained.  Time-out conducted.  Noted no overlying erythema, induration, or other signs of local infection.  Skin prepped in a sterile fashion.  Local anesthesia: Topical Ethyl chloride.  With sterile technique and under real time ultrasound guidance: Mild effusion noted, 1 cc Kenalog 40, 2 cc lidocaine, 2 cc bupivacaine injected easily Completed without difficulty  Advised to call if fevers/chills, erythema, induration, drainage, or persistent bleeding.  Images permanently stored and available for review in PACS.  Impression: Technically successful ultrasound guided injection.   Procedure: Real-time Ultrasound Guided injection of the right knee Device: Samsung HS60  Verbal informed consent obtained.  Time-out conducted.  Noted no overlying erythema, induration, or other signs of local infection.  Skin prepped in a sterile fashion.  Local anesthesia: Topical Ethyl chloride.  With sterile technique and under real time ultrasound guidance: Mild effusion noted, 1 cc Kenalog 40, 2 cc lidocaine, 2 cc bupivacaine injected easily Completed without difficulty  Advised to call if fevers/chills, erythema, induration, drainage, or persistent bleeding.  Images permanently stored and available for review in PACS.  Impression: Technically successful ultrasound guided injection.  Independent interpretation of notes and tests performed by another provider:   None.  Brief History, Exam, Impression, and Recommendations:    Primary osteoarthritis of both knees Known bilateral knee osteoarthritis, meloxicam minimally efficacious, proceeding with bilateral knee joint steroid injections. Follow-up in 6 weeks, if she still has pain in July before her trip we can do another bilateral steroid knee injection. If insufficient relief we will proceed with  viscosupplementation.    ____________________________________________ Beverly Coffey. Benjamin Stain, M.D., ABFM., CAQSM., AME. Primary Care and Sports Medicine [place] [facility]  Adjunct Professor of Family Medicine  [place] [employer] [facility] [employer]  Restaurant manager, fast food

## 2022-06-04 NOTE — Assessment & Plan Note (Signed)
Known bilateral knee osteoarthritis, meloxicam minimally efficacious, proceeding with bilateral knee joint steroid injections. Follow-up in 6 weeks, if she still has pain in July before her trip we can do another bilateral steroid knee injection. If insufficient relief we will proceed with viscosupplementation.

## 2022-06-09 ENCOUNTER — Telehealth: Payer: Self-pay | Admitting: Family Medicine

## 2022-06-09 NOTE — Telephone Encounter (Signed)
Pt's husband called stating wife has really bad acid reflux. They are requesting a referral to digestive health. I told them they might need an appt w/pcp first.

## 2022-06-11 ENCOUNTER — Ambulatory Visit (INDEPENDENT_AMBULATORY_CARE_PROVIDER_SITE_OTHER): Payer: BC Managed Care – PPO

## 2022-06-11 DIAGNOSIS — Z1231 Encounter for screening mammogram for malignant neoplasm of breast: Secondary | ICD-10-CM

## 2022-06-11 NOTE — Telephone Encounter (Signed)
Mark advised. He will call back to schedule an appointment.

## 2022-06-13 ENCOUNTER — Other Ambulatory Visit: Payer: Self-pay | Admitting: Family Medicine

## 2022-06-13 ENCOUNTER — Encounter: Payer: Self-pay | Admitting: Family Medicine

## 2022-06-13 DIAGNOSIS — R928 Other abnormal and inconclusive findings on diagnostic imaging of breast: Secondary | ICD-10-CM

## 2022-06-16 ENCOUNTER — Encounter (INDEPENDENT_AMBULATORY_CARE_PROVIDER_SITE_OTHER): Payer: BC Managed Care – PPO | Admitting: Family Medicine

## 2022-06-16 DIAGNOSIS — E559 Vitamin D deficiency, unspecified: Secondary | ICD-10-CM | POA: Diagnosis not present

## 2022-06-26 ENCOUNTER — Other Ambulatory Visit: Payer: Self-pay | Admitting: Family Medicine

## 2022-06-26 ENCOUNTER — Ambulatory Visit
Admission: RE | Admit: 2022-06-26 | Discharge: 2022-06-26 | Disposition: A | Discharge status: Home / Self Care | Payer: BC Managed Care – PPO | Source: Ambulatory Visit | Attending: Family Medicine | Admitting: Family Medicine

## 2022-06-26 DIAGNOSIS — N6001 Solitary cyst of right breast: Secondary | ICD-10-CM | POA: Diagnosis not present

## 2022-06-26 DIAGNOSIS — N6310 Unspecified lump in the right breast, unspecified quadrant: Secondary | ICD-10-CM

## 2022-06-26 DIAGNOSIS — R928 Other abnormal and inconclusive findings on diagnostic imaging of breast: Secondary | ICD-10-CM

## 2022-07-01 ENCOUNTER — Ambulatory Visit
Admission: RE | Admit: 2022-07-01 | Discharge: 2022-07-01 | Disposition: A | Discharge status: Home / Self Care | Payer: BC Managed Care – PPO | Source: Ambulatory Visit | Attending: Family Medicine | Admitting: Family Medicine

## 2022-07-01 DIAGNOSIS — R928 Other abnormal and inconclusive findings on diagnostic imaging of breast: Secondary | ICD-10-CM

## 2022-07-01 DIAGNOSIS — N6001 Solitary cyst of right breast: Secondary | ICD-10-CM | POA: Diagnosis not present

## 2022-07-01 DIAGNOSIS — N6310 Unspecified lump in the right breast, unspecified quadrant: Secondary | ICD-10-CM

## 2022-07-08 NOTE — Telephone Encounter (Signed)
Please see the MyChart message reply(ies) for my assessment and plan.    This patient gave consent for this Medical Advice Message and is aware that it may result in a bill to [employer], as well as the possibility of receiving a bill for a co-payment or deductible. They are an established patient, but are not seeking medical advice exclusively about a problem treated during an in person or video visit in the last seven days. I did not recommend an in person or video visit within seven days of my reply.    I spent a total of 6 minutes cumulative time within 7 days through [employer].  Pt wants vit d level checked. Have ordered vit d level. Viewed chart and pt does have hx of vit d deficiency  Charlton Amor, DO

## 2022-07-16 ENCOUNTER — Ambulatory Visit (INDEPENDENT_AMBULATORY_CARE_PROVIDER_SITE_OTHER): Payer: BC Managed Care – PPO | Admitting: Sports Medicine

## 2022-07-16 DIAGNOSIS — M170 Bilateral primary osteoarthritis of knee: Secondary | ICD-10-CM

## 2022-07-16 NOTE — Assessment & Plan Note (Signed)
Is a very pleasant 67 year old female, she has knee osteoarthritis, we did a steroid injection April 24, she continues to do extremely well, she does have a trip coming up in July, she is going to make an appointment for Friday before she leaves, this will be about 2 and half months after her injection, if she has recurrence of pain I am happy to do the shot a little bit early, but if she is doing well she will cancel the appointment.

## 2022-07-16 NOTE — Progress Notes (Signed)
    Procedures performed today:    None.  Independent interpretation of notes and tests performed by another provider:   None.  Brief History, Exam, Impression, and Recommendations:    Primary osteoarthritis of both knees Is a very pleasant 67 year old female, she has knee osteoarthritis, we did a steroid injection April 24, she continues to do extremely well, she does have a trip coming up in July, she is going to make an appointment for Friday before she leaves, this will be about 2 and half months after her injection, if she has recurrence of pain I am happy to do the shot a little bit early, but if she is doing well she will cancel the appointment.    ____________________________________________ Ihor Austin. Benjamin Stain, M.D., ABFM., CAQSM., AME. Primary Care and Sports Medicine [place] [facility]  Adjunct Professor of Family Medicine  [place] [employer] [facility] [employer]  Restaurant manager, fast food

## 2022-08-19 DIAGNOSIS — Z1211 Encounter for screening for malignant neoplasm of colon: Secondary | ICD-10-CM | POA: Diagnosis not present

## 2022-08-19 DIAGNOSIS — K5730 Diverticulosis of large intestine without perforation or abscess without bleeding: Secondary | ICD-10-CM | POA: Diagnosis not present

## 2022-08-19 LAB — HM COLONOSCOPY

## 2022-08-22 ENCOUNTER — Telehealth: Payer: Self-pay | Admitting: Sports Medicine

## 2022-08-22 ENCOUNTER — Other Ambulatory Visit (INDEPENDENT_AMBULATORY_CARE_PROVIDER_SITE_OTHER): Payer: BC Managed Care – PPO

## 2022-08-22 ENCOUNTER — Ambulatory Visit: Payer: BC Managed Care – PPO | Admitting: Sports Medicine

## 2022-08-22 DIAGNOSIS — M170 Bilateral primary osteoarthritis of knee: Secondary | ICD-10-CM | POA: Diagnosis not present

## 2022-08-22 NOTE — Progress Notes (Signed)
    Procedures performed today:    Procedure: Real-time Ultrasound Guided injection of the left knee Device: Samsung HS60  Verbal informed consent obtained.  Time-out conducted.  Noted no overlying erythema, induration, or other signs of local infection.  Skin prepped in a sterile fashion.  Local anesthesia: Topical Ethyl chloride.  With sterile technique and under real time ultrasound guidance: Mild effusion noted, 1 cc Kenalog 40, 2 cc lidocaine, 2 cc bupivacaine injected easily Completed without difficulty  Advised to call if fevers/chills, erythema, induration, drainage, or persistent bleeding.  Images permanently stored and available for review in PACS.  Impression: Technically successful ultrasound guided injection.     Procedure: Real-time Ultrasound Guided injection of the right knee Device: Samsung HS60  Verbal informed consent obtained.  Time-out conducted.  Noted no overlying erythema, induration, or other signs of local infection.  Skin prepped in a sterile fashion.  Local anesthesia: Topical Ethyl chloride.  With sterile technique and under real time ultrasound guidance: Mild effusion noted, 1 cc Kenalog 40, 2 cc lidocaine, 2 cc bupivacaine injected easily Completed without difficulty  Advised to call if fevers/chills, erythema, induration, drainage, or persistent bleeding.  Images permanently stored and available for review in PACS.  Impression: Technically successful ultrasound guided injection.  Independent interpretation of notes and tests performed by another provider:   None.  Brief History, Exam, Impression, and Recommendations:    Primary osteoarthritis of both knees This is a very pleasant 67 year old female with known bilateral knee osteoarthritis, she had a steroid injection about 2 and half months before, she is having recurrence of pain, she does have a trip coming up, we will do early injections today, due to failure of conservative treatment,  viscosupplementation not covered by her insurance, and lack of desire to proceed with arthroplasty I would like her to consult for genicular artery embolization. Update we will try to get her approved again for viscosupplementation.    ____________________________________________ Ihor Austin. Benjamin Stain, M.D., ABFM., CAQSM., AME. Primary Care and Sports Medicine [place] [facility]  Adjunct Professor of Family Medicine  [place] [employer] [facility] of Medicine  Restaurant manager, fast food

## 2022-08-22 NOTE — Assessment & Plan Note (Addendum)
This is a very pleasant 67 year old female with known bilateral knee osteoarthritis, she had a steroid injection about 2 and half months before, she is having recurrence of pain, she does have a trip coming up, we will do early injections today, due to failure of conservative treatment, viscosupplementation not covered by her insurance, and lack of desire to proceed with arthroplasty I would like her to consult for genicular artery embolization. Update we will try to get her approved again for viscosupplementation.

## 2022-08-22 NOTE — Telephone Encounter (Signed)
[employer] approval please, bilateral x-ray confirmed osteoarthritis, failed NSAIDs, therapy, steroid injections.

## 2022-08-25 NOTE — Telephone Encounter (Signed)
PA information submitted via [email or web address] for Orthovisc Paperwork has been printed and given to Dr. T for signatures. Once obtained, information will be faxed to [employer] at [phone]  

## 2022-09-05 NOTE — Telephone Encounter (Signed)
Still awaiting next steps for her Orthovisc approval

## 2022-09-30 NOTE — Telephone Encounter (Signed)
PA went through my visco program and they have dave discussed with her the options she have.

## 2022-10-27 ENCOUNTER — Telehealth: Payer: Self-pay | Admitting: Family Medicine

## 2022-10-27 NOTE — Telephone Encounter (Signed)
Patient dropped off disability placard paperwork to be  filled out. Paperwork is in Dr. Melvia Heaps box

## 2022-10-28 NOTE — Telephone Encounter (Signed)
Patient was informed paperwork is complete and ready for pick. Paperwork is in the accordion.

## 2022-11-18 ENCOUNTER — Ambulatory Visit (INDEPENDENT_AMBULATORY_CARE_PROVIDER_SITE_OTHER): Payer: BC Managed Care – PPO | Admitting: Sports Medicine

## 2022-11-18 ENCOUNTER — Encounter: Payer: Self-pay | Admitting: Sports Medicine

## 2022-11-18 DIAGNOSIS — M170 Bilateral primary osteoarthritis of knee: Secondary | ICD-10-CM

## 2022-11-18 DIAGNOSIS — S83209D Unspecified tear of unspecified meniscus, current injury, unspecified knee, subsequent encounter: Secondary | ICD-10-CM | POA: Diagnosis not present

## 2022-11-18 MED ORDER — TRAMADOL HCL 50 MG PO TABS
50.0000 mg | ORAL_TABLET | Freq: Three times a day (TID) | ORAL | 0 refills | Status: DC | PRN
Start: 2022-11-18 — End: 2023-07-29

## 2022-11-18 NOTE — Patient Instructions (Signed)
Please call [facility] at [phone] to get set up for the consult for the geniculate artery embolization.

## 2022-11-18 NOTE — Assessment & Plan Note (Signed)
This pleasant 67 year old female returns, she has known bilateral knee osteoarthritis, she had a steroid injection back in late April or early May, we did a repeat injection in mid July, this was an early injection, she did not get good relief and is asking for an early injection today. I did inform her we would not be doing any more steroid injections. She did not get approved for viscosupplementation. I did place a consult for geniculate artery embolization but she did not follow through with it. We will get her the phone number so she can call and set this up herself. In addition she is having some locking, she is unable to flex past about 90 degrees, we will proceed with MRI for this reason, we will be looking for both meniscal tearing but also subchondral insufficiency fractures. I did explain to her that in the setting of severe osteoarthritis a meniscal tear cannot be treated arthroscopically. I would also like consultation with orthopedic surgery. We will upgrade her to tramadol.

## 2022-11-18 NOTE — Progress Notes (Signed)
    Procedures performed today:    None.  Independent interpretation of notes and tests performed by another provider:   None.  Brief History, Exam, Impression, and Recommendations:    Primary osteoarthritis of both knees This pleasant 67 year old female returns, she has known bilateral knee osteoarthritis, she had a steroid injection back in late April or early May, we did a repeat injection in mid July, this was an early injection, she did not get good relief and is asking for an early injection today. I did inform her we would not be doing any more steroid injections. She did not get approved for viscosupplementation. I did place a consult for geniculate artery embolization but she did not follow through with it. We will get her the phone number so she can call and set this up herself. In addition she is having some locking, she is unable to flex past about 90 degrees, we will proceed with MRI for this reason, we will be looking for both meniscal tearing but also subchondral insufficiency fractures. I did explain to her that in the setting of severe osteoarthritis a meniscal tear cannot be treated arthroscopically. I would also like consultation with orthopedic surgery. We will upgrade her to tramadol.    ____________________________________________ Ihor Austin. Benjamin Stain, M.D., ABFM., CAQSM., AME. Primary Care and Sports Medicine [place] [facility]  Adjunct Professor of Family Medicine  [place] [employer] [facility] Medicine  Restaurant manager, fast food

## 2022-11-25 ENCOUNTER — Encounter: Payer: Self-pay | Admitting: Family Medicine

## 2022-11-25 ENCOUNTER — Ambulatory Visit (INDEPENDENT_AMBULATORY_CARE_PROVIDER_SITE_OTHER): Payer: BC Managed Care – PPO | Admitting: Family Medicine

## 2022-11-25 VITALS — BP 134/86 | HR 71 | Ht 61.0 in | Wt 181.0 lb

## 2022-11-25 DIAGNOSIS — Z780 Asymptomatic menopausal state: Secondary | ICD-10-CM

## 2022-11-25 DIAGNOSIS — R5383 Other fatigue: Secondary | ICD-10-CM | POA: Diagnosis not present

## 2022-11-25 DIAGNOSIS — E559 Vitamin D deficiency, unspecified: Secondary | ICD-10-CM | POA: Insufficient documentation

## 2022-11-25 DIAGNOSIS — K219 Gastro-esophageal reflux disease without esophagitis: Secondary | ICD-10-CM | POA: Insufficient documentation

## 2022-11-25 DIAGNOSIS — R1319 Other dysphagia: Secondary | ICD-10-CM | POA: Insufficient documentation

## 2022-11-25 MED ORDER — OMEPRAZOLE 20 MG PO CPDR: 20.0000 mg | DELAYED_RELEASE_CAPSULE | Freq: Every day | ORAL | 4 refills | Status: AC

## 2022-11-25 NOTE — Assessment & Plan Note (Signed)
Pt has a hx of fatigue lately. Does have a pmh of vit d deficiency. Will go ahead and order labs

## 2022-11-25 NOTE — Patient Instructions (Signed)
Pick up acid reflux medicine, omeprazole, from pharmacy. If not covered by insurance it is an over the counter medicine you can just buy   Dexa scan ordered   Gi referral placed

## 2022-11-25 NOTE — Assessment & Plan Note (Signed)
Pt having symptoms of acid reflux. Will go ahead and order omeprazole

## 2022-11-25 NOTE — Assessment & Plan Note (Signed)
Pt notes some difficulty with eating bigger pieces of food and notes that it feels like it gets caught in her esophagus. She says sprite will sometimes help it get unstuck or standing up. Placed referral to GI as patient may need EGD to evaluate for stricture or needing her esophagus stretched. No weight loss noted since last visit. No night sweats.

## 2022-11-25 NOTE — Progress Notes (Signed)
Established patient visit   Patient: Beverly Coffey   DOB: 01-10-1956   67 y.o. Female  MRN: [id number] Visit Date: 11/25/2022  Today's healthcare provider: Charlton Amor, DO   Chief Complaint  Patient presents with   Gastroesophageal Reflux    Trouble swallowing and acid reflux    SUBJECTIVE    Chief Complaint  Patient presents with   Gastroesophageal Reflux    Trouble swallowing and acid reflux   HPI HPI     Gastroesophageal Reflux    Additional comments: Trouble swallowing and acid reflux      Last edited by Roselyn Reef, CMA on 11/25/2022 10:32 AM.       Pt presents for concerns about acid reflux. Says certain foods cause more acid build up. Also having concerns of bigger pieces of foods such as chicken   Review of Systems  Constitutional:  Negative for activity change, fatigue and fever.  Respiratory:  Negative for cough and shortness of breath.   Cardiovascular:  Negative for chest pain.  Gastrointestinal:  Negative for abdominal pain.  Genitourinary:  Negative for difficulty urinating.       Current Meds  Medication Sig   meloxicam (MOBIC) 15 MG tablet One tab PO every 24 hours with a meal for 2 weeks, then once every 24 hours prn pain.   omeprazole (PRILOSEC) 20 MG capsule Take 1 capsule (20 mg total) by mouth daily.   traMADol (ULTRAM) 50 MG tablet Take 1 tablet (50 mg total) by mouth every 8 (eight) hours as needed for moderate pain.    OBJECTIVE    BP 134/86 (BP Location: Left Arm, Patient Position: Sitting, Cuff Size: Normal)   Pulse 71   Ht 5\' 1"  (1.549 m)   Wt 181 lb (82.1 kg)   SpO2 100%   BMI 34.20 kg/m   Physical Exam Vitals and nursing note reviewed.  Constitutional:      General: She is not in acute distress.    Appearance: Normal appearance.  HENT:     Head: Normocephalic and atraumatic.     Right Ear: External ear normal.     Left Ear: External ear normal.     Nose: Nose normal.  Eyes:     Conjunctiva/sclera:  Conjunctivae normal.  Cardiovascular:     Rate and Rhythm: Normal rate.  Pulmonary:     Effort: Pulmonary effort is normal.  Neurological:     General: No focal deficit present.     Mental Status: She is alert and oriented to person, place, and time.  Psychiatric:        Mood and Affect: Mood normal.        Behavior: Behavior normal.        Thought Content: Thought content normal.        Judgment: Judgment normal.        ASSESSMENT & PLAN    Problem List Items Addressed This Visit       Digestive   Gastroesophageal reflux disease without esophagitis    Pt having symptoms of acid reflux. Will go ahead and order omeprazole       Relevant Medications   omeprazole (PRILOSEC) 20 MG capsule   Esophageal dysphagia    Pt notes some difficulty with eating bigger pieces of food and notes that it feels like it gets caught in her esophagus. She says sprite will sometimes help it get unstuck or standing up. Placed referral to GI as patient may need EGD to evaluate  for stricture or needing her esophagus stretched. No weight loss noted since last visit. No night sweats.       Relevant Orders   Ambulatory referral to Gastroenterology     Other   Vitamin D deficiency - Primary   Relevant Orders   Vitamin D (25 hydroxy)   Other fatigue    Pt has a hx of fatigue lately. Does have a pmh of vit d deficiency. Will go ahead and order labs       Relevant Orders   Vitamin B12   TSH + free T4   Other Visit Diagnoses     Post-menopausal       Relevant Orders   DG Bone Density       No follow-ups on file.      Meds ordered this encounter  Medications   omeprazole (PRILOSEC) 20 MG capsule    Sig: Take 1 capsule (20 mg total) by mouth daily.    Dispense:  30 capsule    Refill:  4    Orders Placed This Encounter  Procedures   DG Bone Density    Standing Status:   Future    Standing Expiration Date:   11/25/2023    Order Specific Question:   Reason for Exam (SYMPTOM  OR  DIAGNOSIS REQUIRED)    Answer:   Eval bone density    Order Specific Question:   Preferred imaging location?    Answer:   [facility]   Vitamin D (25 hydroxy)   Vitamin B12   TSH + free T4   Ambulatory referral to Gastroenterology    Referral Priority:   Routine    Referral Type:   Consultation    Referral Reason:   Specialty Services Required    Referred to Provider:   [facility]    Number of Visits Requested:   1     Charlton Amor, DO  [facility] Primary Care & Sports Medicine at [facility] [phone] (phone) [phone] (fax)  [facility]

## 2022-11-26 ENCOUNTER — Encounter: Payer: Self-pay | Admitting: Family Medicine

## 2022-11-26 ENCOUNTER — Other Ambulatory Visit: Payer: Self-pay | Admitting: Family Medicine

## 2022-11-26 DIAGNOSIS — E559 Vitamin D deficiency, unspecified: Secondary | ICD-10-CM

## 2022-11-26 LAB — TSH+FREE T4
Free T4: 1.39 ng/dL (ref 0.82–1.77)
TSH: 0.961 u[IU]/mL (ref 0.450–4.500)

## 2022-11-26 LAB — VITAMIN D 25 HYDROXY (VIT D DEFICIENCY, FRACTURES): Vit D, 25-Hydroxy: 16.1 ng/mL — ABNORMAL LOW (ref 30.0–100.0)

## 2022-11-26 LAB — VITAMIN B12: Vitamin B-12: 324 pg/mL (ref 232–1245)

## 2022-11-26 MED ORDER — CHOLECALCIFEROL 20 MCG (800 UNIT) PO TABS
1.0000 | ORAL_TABLET | Freq: Every day | ORAL | 0 refills | Status: AC
Start: 2022-11-26 — End: ?

## 2022-12-17 ENCOUNTER — Other Ambulatory Visit: Payer: BC Managed Care – PPO

## 2022-12-17 ENCOUNTER — Encounter: Payer: Self-pay | Admitting: Family Medicine

## 2022-12-17 DIAGNOSIS — Z780 Asymptomatic menopausal state: Secondary | ICD-10-CM

## 2022-12-24 NOTE — Progress Notes (Signed)
Chief Complaint: Patient was seen in virtual telephone consultation today for bilateral knee pain.   Referring Physician(s): Rodney Langton J  History of Present Illness: Beverly Coffey is a 67 y.o. female with a medical history significant for anxiety and osteoarthritis with chronic bilateral knee pain. She has had several rounds of steroid injections with the last set of injections in mid-July. She did not get acceptable relief from these and Dr. Benjamin Stain does not think additional steroid injections will be beneficial. She unfortunately was not approved for viscosupplementation. In addition to knee pain she complains of some locking and she is unable to flex her knees past about 90 degrees. Dr.Thekkekandam has ordered an MRI of her left knee.   The patient has been referred for consultation with orthopedic surgery and she's also been referred to our team for evaluation of geniculate artery embolization. She presents today via virtual tele-health visit for further discussion.  Her left is worse than the right.  About 15 years ago she had meniscal repair on the right knee.  The pain significantly affects her mobility.  She is averse to having TKA and wants to wait as long as possible.   WOMAC Knee Pain Score: 74/96 VAS Knee Pain Score:  9/10  Past Medical History:  Diagnosis Date   Anxiety    Bilateral chronic knee pain    Vitamin D deficiency     Past Surgical History:  Procedure Laterality Date   KNEE ARTHROSCOPY W/ MENISCAL REPAIR Left     Allergies: Patient has no known allergies.  Medications: Prior to Admission medications   Medication Sig Start Date End Date Taking? Authorizing Provider  Cholecalciferol 20 MCG (800 UNIT) TABS Take 1 tablet by mouth daily. 11/26/22   Charlton Amor, DO  meloxicam (MOBIC) 15 MG tablet One tab PO every 24 hours with a meal for 2 weeks, then once every 24 hours prn pain. 05/07/22   Monica Becton, MD  omeprazole (PRILOSEC)  20 MG capsule Take 1 capsule (20 mg total) by mouth daily. 11/25/22   Charlton Amor, DO  traMADol (ULTRAM) 50 MG tablet Take 1 tablet (50 mg total) by mouth every 8 (eight) hours as needed for moderate pain. 11/18/22   Monica Becton, MD     Family History  Problem Relation Age of Onset   Diabetes Mother    Heart attack Mother    Hypertension Mother    Rheum arthritis Father    Parkinson's disease Father    Prostate cancer Brother     Social History   Socioeconomic History   Marital status: Married    Spouse name: Not on file   Number of children: Not on file   Years of education: Not on file   Highest education level: Not on file  Occupational History   Not on file  Tobacco Use   Smoking status: Never   Smokeless tobacco: Never  Vaping Use   Vaping status: Never Used  Substance and Sexual Activity   Alcohol use: No   Drug use: Never   Sexual activity: Yes    Birth control/protection: Post-menopausal  Other Topics Concern   Not on file  Social History Narrative   Not on file   Social Determinants of Health   Financial Resource Strain: Not on file  Food Insecurity: Not on file  Transportation Needs: Not on file  Physical Activity: Not on file  Stress: Not on file  Social Connections: Unknown (05/23/2022)   Received from [facility], [facility]   Social Network    Social Network: Not on file    Review of Systems: A 12 point ROS discussed and pertinent positives are indicated in the HPI above.  All other systems are negative.  Vital Signs: There were no vitals taken for this visit.  Advance Care Plan: The advanced care plan/surrogate decision maker was discussed at the time of visit and documented in the medical record.   No physical exam was performed in lieu of virtual telephone visit.   Imaging: Bilateral knee radiographs (05/07/22)  Kellgren and Lawrence Grade 3 bilaterally  Labs:  CBC: Recent Labs    04/28/22 1425  WBC 6.3  HGB  13.7  HCT 40.9  PLT 308    COAGS: No results for input(s): "INR", "APTT" in the last 8760 hours.  BMP: Recent Labs    04/28/22 1425  NA 143  K 4.3  CL 108  CO2 26  GLUCOSE 82  BUN 23  CALCIUM 9.3  CREATININE 0.59    LIVER FUNCTION TESTS: Recent Labs    04/28/22 1425  BILITOT 0.5  AST 25  ALT 24  PROT 7.3    TUMOR MARKERS: No results for input(s): "AFPTM", "CEA", "CA199", "CHROMGRNA" in the last 8760 hours.  Assessment and Plan: 67 year old female with a history of advanced bilateral osteoarthritis (K&G 3) with lifestyle-limited, severe chronic bilateral (left greater than right) knee pain (WOMAC 74/96, VAS 9/10).  She would be an excellent candidate for geniculate artery embolization to improve her pain.  We discussed the rationale, periprocedural expectations including risks and benefits, and she wishes to proceed.  Dr. Benjamin Stain has ordered a left knee MRI without contrast which has not been obtained yet.  I will ask our office to change this to with contrast to assess for synovial hyperemia which could allow our embolization to be more precise.    Plan for left knee geniculate artery embolization at [facility].  Left femoral antegrade approach.  If she responds well to the left GAE, we can consider also treating the right.  Marliss Coots, MD Pager: [phone]    I spent a total of  30 Minutes   in virtual telephone clinical consultation, greater than 50% of which was counseling/coordinating care for bilateral knee pain.

## 2022-12-26 ENCOUNTER — Ambulatory Visit
Admission: RE | Admit: 2022-12-26 | Discharge: 2022-12-26 | Disposition: A | Discharge status: Home / Self Care | Payer: BC Managed Care – PPO | Source: Ambulatory Visit | Attending: Sports Medicine | Admitting: Sports Medicine

## 2022-12-26 DIAGNOSIS — M170 Bilateral primary osteoarthritis of knee: Secondary | ICD-10-CM

## 2022-12-26 HISTORY — PX: IR RADIOLOGIST EVAL & MGMT: IMG5224

## 2023-01-19 ENCOUNTER — Ambulatory Visit
Admission: RE | Admit: 2023-01-19 | Discharge: 2023-01-19 | Disposition: A | Discharge status: Home / Self Care | Payer: BC Managed Care – PPO | Source: Ambulatory Visit | Attending: Sports Medicine | Admitting: Sports Medicine

## 2023-01-19 DIAGNOSIS — M170 Bilateral primary osteoarthritis of knee: Secondary | ICD-10-CM

## 2023-01-21 ENCOUNTER — Telehealth: Payer: Self-pay

## 2023-01-21 NOTE — Telephone Encounter (Signed)
Spoke with tabitha from radiology and she said it had been resolved they needed to know if it was with or without contrast, they did it w/o

## 2023-01-21 NOTE — Telephone Encounter (Unsigned)
Copied from CRM [id number]. Topic: Clinical - Lab/Test Results >> Jan 19, 2023 10:12 AM Clayton Bibles wrote: Reason for CRM: Lawson Fiscal with [facility] would like Dr. Tamera Punt' nurse to call her about a referral. She has a question about the order. Please call [phone]. Thanks

## 2023-01-27 ENCOUNTER — Encounter: Payer: Self-pay | Admitting: Sports Medicine

## 2023-01-27 DIAGNOSIS — M170 Bilateral primary osteoarthritis of knee: Secondary | ICD-10-CM

## 2023-01-28 ENCOUNTER — Telehealth: Payer: Self-pay | Admitting: Sports Medicine

## 2023-01-28 NOTE — Telephone Encounter (Signed)
Orthovisc approval please, bilateral, failed everything

## 2023-02-26 NOTE — Telephone Encounter (Signed)
To Janyth Pupa regarding the gel, as for the embolization if they have not contacted her she should call  imaging to get on the schedule.

## 2023-03-05 ENCOUNTER — Other Ambulatory Visit: Payer: Self-pay | Admitting: Interventional Radiology

## 2023-03-05 DIAGNOSIS — M1712 Unilateral primary osteoarthritis, left knee: Secondary | ICD-10-CM

## 2023-03-13 ENCOUNTER — Inpatient Hospital Stay: Admission: RE | Admit: 2023-03-13 | Payer: BC Managed Care – PPO | Source: Ambulatory Visit

## 2023-04-30 ENCOUNTER — Encounter: Payer: Self-pay | Admitting: Sports Medicine

## 2023-04-30 DIAGNOSIS — M170 Bilateral primary osteoarthritis of knee: Secondary | ICD-10-CM

## 2023-05-01 ENCOUNTER — Other Ambulatory Visit: Payer: Self-pay | Admitting: Family Medicine

## 2023-05-01 DIAGNOSIS — Z1231 Encounter for screening mammogram for malignant neoplasm of breast: Secondary | ICD-10-CM

## 2023-05-04 MED ORDER — MELOXICAM 15 MG PO TABS
ORAL_TABLET | ORAL | 3 refills | Status: DC
Start: 2023-05-04 — End: 2023-08-26

## 2023-05-04 NOTE — Telephone Encounter (Signed)
 Message from patient as below inprevious message-  Do you know if this has been addressed ? Thanks Diannia Ruder    04/30/23  8:02 PM Can I please get my handicap renewed.It expires March 24th. I can pick up the paper tomorrow.  March 21st. Especially since I am getting my genicular artery embolization om March 28th. Thank.you

## 2023-05-04 NOTE — Telephone Encounter (Signed)
 Please see patient note attached to this message Patient also requesting rx rf of meloxicam Last written 05/07/2022 Last OV 11/25/2022 dr. Tamera Punt Upcoming appt 07/27/2023

## 2023-05-06 NOTE — Progress Notes (Signed)
 Chief Complaint: Patient was seen in consultation today for left knee pain   Referring Physician(s): Thekkekandam,Thomas J   History of Present Illness: Beverly Coffey is a 68 y.o. female with a medical history significant for anxiety and osteoarthritis with chronic bilateral knee pain. She has had several rounds of steroid injections with the last set of injections in mid-July. She did not get acceptable relief from these and Dr. Benjamin Stain does not think additional steroid injections will be beneficial. She unfortunately was not approved for viscosupplementation. In addition to knee pain she complains of some locking and she is unable to flex her knees past about 90 degrees.    The patient has been referred for consultation with orthopedic surgery and she was also referred to our team for evaluation of geniculate artery embolization. We met in consultation 12/26/22 and she described her left knee pain being worse than the right. About 15 years ago she had meniscal repair on the right knee. The pain significantly affects her mobility. She is averse to having TKA and wants to wait as long as possible.   WOMAC Knee Pain Score: 74/96 VAS Knee Pain Score:  9/10  We discussed the rationale, periprocedural expectations including risks and benefits of geniculate artery embolization. She wished to proceed and we discussed starting with the left knee. If she responds well to the left GAE we can consider also treating the right knee. Dr. Benjamin Stain ordered a left knee MRI without contrast which had not been obtained at the time of our consultation. The MRI was completed 01/19/23 and this showed two tears in the medial meniscus, peripheral meniscal extrusion with degeneration and tricompartmental cartilage abnormalities. No changes in her symptoms or overall health since last visit.  Past Medical History:  Diagnosis Date   Anxiety    Bilateral chronic knee pain    Vitamin D deficiency     Past  Surgical History:  Procedure Laterality Date   IR RADIOLOGIST EVAL & MGMT  12/26/2022   KNEE ARTHROSCOPY W/ MENISCAL REPAIR Left     Allergies: Patient has no known allergies.  Medications: Prior to Admission medications   Medication Sig Start Date End Date Taking? Authorizing Provider  Cholecalciferol 20 MCG (800 UNIT) TABS Take 1 tablet by mouth daily. 11/26/22   Charlton Amor, DO  meloxicam (MOBIC) 15 MG tablet One tab PO every 24 hours with a meal for 2 weeks, then once every 24 hours prn pain. 05/04/23   Monica Becton, MD  omeprazole (PRILOSEC) 20 MG capsule Take 1 capsule (20 mg total) by mouth daily. 11/25/22   Charlton Amor, DO  traMADol (ULTRAM) 50 MG tablet Take 1 tablet (50 mg total) by mouth every 8 (eight) hours as needed for moderate pain. 11/18/22   Monica Becton, MD     Family History  Problem Relation Age of Onset   Diabetes Mother    Heart attack Mother    Hypertension Mother    Rheum arthritis Father    Parkinson's disease Father    Prostate cancer Brother     Social History   Socioeconomic History   Marital status: Married    Spouse name: Not on file   Number of children: Not on file   Years of education: Not on file   Highest education level: Not on file  Occupational History   Not on file  Tobacco Use   Smoking status: Never   Smokeless tobacco: Never  Vaping Use   Vaping status:  Never Used  Substance and Sexual Activity   Alcohol use: No   Drug use: Never   Sexual activity: Yes    Birth control/protection: Post-menopausal  Other Topics Concern   Not on file  Social History Narrative   Not on file   Social Drivers of Health   Financial Resource Strain: Not on file  Food Insecurity: Not on file  Transportation Needs: Not on file  Physical Activity: Not on file  Stress: Not on file  Social Connections: Unknown (05/23/2022)   Received from [facility]   Social Network    Social Network: Not on file     Review of Systems: A 12 point ROS discussed and pertinent positives are indicated in the HPI above.  All other systems are negative.  Vital Signs: There were no vitals taken for this visit.  Physical Exam Constitutional:      General: She is not in acute distress. HENT:     Head: Normocephalic.     Mouth/Throat:     Mouth: Mucous membranes are moist.     Comments: MP2 Eyes:     General: No scleral icterus. Cardiovascular:     Rate and Rhythm: Normal rate and regular rhythm.  Pulmonary:     Effort: No respiratory distress.  Abdominal:     General: There is no distension.  Musculoskeletal:     Right lower leg: No edema.     Left lower leg: No edema.       Legs:     Comments: Tender to palpation.  Neurological:     Mental Status: She is alert and oriented to person, place, and time.     Imaging: Bilateral knee radiographs (05/07/22)  Kellgren and Lawrence Grade 3 bilaterally  Labs:  CBC: No results for input(s): "WBC", "HGB", "HCT", "PLT" in the last 8760 hours.  COAGS: No results for input(s): "INR", "APTT" in the last 8760 hours.  BMP: No results for input(s): "NA", "K", "CL", "CO2", "GLUCOSE", "BUN", "CALCIUM", "CREATININE", "GFRNONAA", "GFRAA" in the last 8760 hours.  Invalid input(s): "CMP"  LIVER FUNCTION TESTS: No results for input(s): "BILITOT", "AST", "ALT", "ALKPHOS", "PROT", "ALBUMIN" in the last 8760 hours.  TUMOR MARKERS: No results for input(s): "AFPTM", "CEA", "CA199", "CHROMGRNA" in the last 8760 hours.  Assessment and Plan:  Osteoarthritis with chronic bilateral knee pain left > right: Beverly Coffey, 68 year old female, presents today for an image-guided left geniculate artery embolization.   Risks and benefits of this procedure were discussed with the patient including, but not limited to bleeding, infection, vascular injury or contrast induced renal failure.  All of the patient's questions were answered, patient is agreeable to  proceed. She has been NPO.   Consent signed and in chart.  Marliss Coots, MD Pager: [phone]

## 2023-05-07 ENCOUNTER — Telehealth: Payer: Self-pay

## 2023-05-07 MED ORDER — METHYLPREDNISOLONE 4 MG PO TBPK: ORAL_TABLET | ORAL | 0 refills | Status: DC

## 2023-05-07 NOTE — Progress Notes (Signed)
 Reminder call to Beverly Coffey regarding her left GAE 05/08/23. Instructions provided on NPO status, what to expect pre, during, and post procedure, I advised her to wear comfortable clothing, we reviewed her allergies and medications, and reminded her she will need a driver. Time allowed to ask questions Beverly Coffey verbalized understanding.

## 2023-05-07 NOTE — Discharge Instructions (Addendum)
 Discharge Instructions for Genicular Artery Embolization (GAE)   Post-Procedure Care   Activity:   Rest for the remainder of the day.   Avoid strenuous activities and heavy lifting for 48 hours.   Gradually resume normal activities as tolerated.   Pain Management:   You may experience mild pain or discomfort at the catheter insertion site or in the knee. This is normal.   Use over-the-counter pain relievers such as acetaminophen (Tylenol) or ibuprofen (Advil) as directed.   Apply an ice pack to the knee for 15-20 minutes every 2-3 hours to reduce swelling and discomfort.   Take Sol-Medrol Pack as directed per pharmacy.   Wound Care:   Keep the catheter insertion site clean and dry.   Remove the bandage after 24 hours and replace it with a clean, dry bandage if needed.   Avoid soaking in baths, hot tubs, or swimming pools for 5 days. Showers are allowed.   Medications:   Take prescribed medications as directed.   If you were taking blood thinners, follow your physician's instructions on when to resume them.   Diet:   Resume your normal diet.   Drink plenty of fluids to stay hydrated.   Follow-Up:   Schedule a follow-up appointment with your physician as instructed.   Contact your physician if you experience increased pain, swelling, redness, or drainage at the insertion site, or if you have a fever over 100.63F (38C).   When to Seek Immediate Medical Attention   Call [phone] or you may call [phone] and ask to speak with Cala Bradford, RN with any concerns:   Signs of infection at the catheter site (redness, warmth, pus).   Sudden weakness or numbness in the leg.      Call 911 if:   Difficulty breathing or chest pain.   You have severe pain in your abdomen, and it does not get better with medicine.     You have leg pain or leg swelling.   You feel dizzy, or you faint.   Do not wait to see if the symptoms will go away.   Do not drive yourself  to the hospital.   Please ensure you follow these instructions carefully and reach out to your healthcare provider if you have any concerns or questions. Wishing you a smooth and speedy recovery!   If you need assistance after hours (5:00PM) please contact the on-call IR MD at [phone]. Tell them you are a patient of Dr. Elby Showers; you had a GAE today and any issues you are experiencing.                  Thank you for visiting [facility]!!

## 2023-05-08 ENCOUNTER — Ambulatory Visit
Admission: RE | Admit: 2023-05-08 | Discharge: 2023-05-08 | Disposition: A | Discharge status: Home / Self Care | Payer: BC Managed Care – PPO | Source: Ambulatory Visit | Attending: Interventional Radiology | Admitting: Interventional Radiology

## 2023-05-08 ENCOUNTER — Telehealth: Payer: Self-pay

## 2023-05-08 DIAGNOSIS — M1712 Unilateral primary osteoarthritis, left knee: Secondary | ICD-10-CM

## 2023-05-08 HISTORY — PX: IR EMBO ARTERIAL NOT HEMORR HEMANG INC GUIDE ROADMAPPING: IMG5448

## 2023-05-08 MED ORDER — LIDOCAINE-EPINEPHRINE 1 %-1:100000 IJ SOLN
20.0000 mL | Freq: Once | INTRAMUSCULAR | Status: AC
  Administered 2023-05-08: 20 mL via INTRADERMAL

## 2023-05-08 MED ORDER — MIDAZOLAM HCL 2 MG/2ML IJ SOLN
INTRAMUSCULAR | Status: AC | PRN
  Administered 2023-05-08 (×2): .5 mg via INTRAVENOUS
  Administered 2023-05-08 (×2): 1 mg via INTRAVENOUS

## 2023-05-08 MED ORDER — DEXAMETHASONE SODIUM PHOSPHATE 10 MG/ML IJ SOLN
10.0000 mg | Freq: Once | INTRAMUSCULAR | Status: AC
  Administered 2023-05-08: 10 mg via INTRAVENOUS

## 2023-05-08 MED ORDER — SODIUM CHLORIDE 0.9 % IV SOLN
INTRAVENOUS | Status: DC
  Administered 2023-05-08: 250 mL via INTRAVENOUS

## 2023-05-08 MED ORDER — ACETAMINOPHEN 10 MG/ML IV SOLN
1000.0000 mg | Freq: Once | INTRAVENOUS | Status: AC
  Administered 2023-05-08: 1000 mg via INTRAVENOUS

## 2023-05-08 MED ORDER — KETOROLAC TROMETHAMINE 30 MG/ML IJ SOLN
30.0000 mg | INTRAMUSCULAR | Status: AC
  Administered 2023-05-08: 30 mg via INTRAVENOUS

## 2023-05-08 MED ORDER — FENTANYL CITRATE (PF) 100 MCG/2ML IJ SOLN
INTRAMUSCULAR | Status: AC | PRN
  Administered 2023-05-08 (×3): 25 ug via INTRAVENOUS
  Administered 2023-05-08: 50 ug via INTRAVENOUS

## 2023-05-08 MED ORDER — MIDAZOLAM HCL 2 MG/2ML IJ SOLN
1.0000 mg | INTRAMUSCULAR | Status: DC | PRN
Start: 2023-05-08 — End: 2023-05-09

## 2023-05-08 MED ORDER — IODIXANOL 270 MG/ML IV SOLN
50.0000 mL | Freq: Once | INTRAVENOUS | Status: AC
  Administered 2023-05-08: 30 mL

## 2023-05-08 MED ORDER — FENTANYL CITRATE PF 50 MCG/ML IJ SOSY: 25.0000 ug | PREFILLED_SYRINGE | INTRAMUSCULAR | Status: DC | PRN

## 2023-05-08 MED ORDER — NITROGLYCERIN 1 MG/10 ML FOR IR/CATH LAB
100.0000 ug | Freq: Once | INTRA_ARTERIAL | Status: AC
  Administered 2023-05-08: 100 ug via INTRA_ARTERIAL

## 2023-05-08 MED ORDER — DIPHENHYDRAMINE HCL 50 MG/ML IJ SOLN
INTRAMUSCULAR | Status: AC | PRN
  Administered 2023-05-08: 25 mg via INTRAVENOUS

## 2023-05-08 NOTE — Procedures (Signed)
 Interventional Radiology Procedure Note  Procedure: Left geniculate artery embolization   Findings: Please refer to procedural dictation for full description. 0.4 mL 250 micron Embozene particles to embolize articular branch of left descending geniculate artery.  Left proximal SFA 4 Fr access, manual compression for hemostasis.  Complications: None immediate  Estimated Blood Loss: < 5 ml  Recommendations: IR will arrange 1 month outpatient follow up.   Marliss Coots, MD

## 2023-05-08 NOTE — Progress Notes (Signed)
Pt back in nursing recovery area. Pt still drowsy from procedure but will wake up when spoken to. Pt follows commands, talks in complete sentences and has no complaints at this time. Pt will remain in nursing station until discharge.  ?

## 2023-05-18 NOTE — Progress Notes (Signed)
 Follow up call regarding GAE on 05/08/23, Mrs. Beverly Coffey returned my call today (message left on 05/15/23). Mrs. Beverly Coffey reports she is going much better, she uses her walker from to time and only states she has stiffness in her knee periodically. She also states she completed her medrol dose pak. I advised Mrs. Beverly Coffey to call with any questions or concerns she verbalized understanding.

## 2023-06-06 NOTE — Progress Notes (Signed)
 This encounter was conducted via the [employer] providing interactive audio and visual communication.  The patient provided verbal consent to conduct a virtual appointment.  The patient was located at their primary residence during this encounter.  Referring Physician(s): Gean Keels  Chief Complaint: The patient is seen in virtual video follow up today s/p left geniculate artery embolization 05/08/23  History of present illness: HPI from initial consultation 12/26/22 Beverly Coffey is a 68 y.o. female with a medical history significant for anxiety and osteoarthritis with chronic bilateral knee pain. She has had several rounds of steroid injections with the last set of injections in mid-July. She did not get acceptable relief from these and Dr. Sandy Crumb does not think additional steroid injections will be beneficial. She unfortunately was not approved for viscosupplementation. In addition to knee pain she complains of some locking and she is unable to flex her knees past about 90 degrees. Dr.Thekkekandam has ordered an MRI of her left knee.    The patient has been referred for consultation with orthopedic surgery and she's also been referred to our team for evaluation of geniculate artery embolization. She presents today via virtual tele-health visit for further discussion.  Her left is worse than the right.  About 15 years ago she had meniscal repair on the right knee.  The pain significantly affects her mobility.  She is averse to having TKA and wants to wait as long as possible.    WOMAC Knee Pain Score: 74/96 VAS Knee Pain Score:  9/10  We discussed the rationale, periprocedural expectations including risks and benefits of geniculate artery embolization. She wished to proceed and we discussed starting with the left knee. If she responds well to the left GAE we can consider also treating the right knee. Dr. Sandy Crumb ordered a left knee MRI without contrast and this  showed two tears in the medial meniscus, peripheral meniscal extrusion with degeneration and tricompartmental cartilage abnormalities. She underwent a technically successful left geniculate artery embolization 05/08/23 and she presents today for follow up via virtual video visit.   Today she reports significantly improved range of motion of the left knee.  Her pain has decreased from 9/10 to 3/10.  She still has some stiffness in the knee which she is hopeful will improve.  Her right knee now bothers her the most.  She uses an ice pack occasionally on her knees.  She is still taking meloxicam .     Past Medical History:  Diagnosis Date   Anxiety    Bilateral chronic knee pain    Vitamin D  deficiency     Past Surgical History:  Procedure Laterality Date   IR EMBO ARTERIAL NOT HEMORR HEMANG INC GUIDE ROADMAPPING  05/08/2023   IR RADIOLOGIST EVAL & MGMT  12/26/2022   KNEE ARTHROSCOPY W/ MENISCAL REPAIR Left     Allergies: Patient has no known allergies.  Medications: Prior to Admission medications   Medication Sig Start Date End Date Taking? Authorizing Provider  Cholecalciferol  20 MCG (800 UNIT) TABS Take 1 tablet by mouth daily. 11/26/22   Josepha Nickels, DO  meloxicam  (MOBIC ) 15 MG tablet One tab PO every 24 hours with a meal for 2 weeks, then once every 24 hours prn pain. 05/04/23   Gean Keels, MD  methylPREDNISolone  (MEDROL  DOSEPAK) 4 MG TBPK tablet Taper Per Instructions 05/07/23   Smitty Ackerley, Antonia Battiest, MD  omeprazole  (PRILOSEC) 20 MG capsule Take 1 capsule (20 mg total) by mouth daily. 11/25/22   Dianah Fort,  Burgess Caroline, DO  traMADol  (ULTRAM ) 50 MG tablet Take 1 tablet (50 mg total) by mouth every 8 (eight) hours as needed for moderate pain. 11/18/22   Gean Keels, MD     Family History  Problem Relation Age of Onset   Diabetes Mother    Heart attack Mother    Hypertension Mother    Rheum arthritis Father    Parkinson's disease Father    Prostate cancer Brother      Social History   Socioeconomic History   Marital status: Married    Spouse name: Not on file   Number of children: Not on file   Years of education: Not on file   Highest education level: Not on file  Occupational History   Not on file  Tobacco Use   Smoking status: Never   Smokeless tobacco: Never  Vaping Use   Vaping status: Never Used  Substance and Sexual Activity   Alcohol use: No   Drug use: Never   Sexual activity: Yes    Birth control/protection: Post-menopausal  Other Topics Concern   Not on file  Social History Narrative   Not on file   Social Drivers of Health   Financial Resource Strain: Not on file  Food Insecurity: Not on file  Transportation Needs: Not on file  Physical Activity: Not on file  Stress: Not on file  Social Connections: Unknown (05/23/2022)   Received from [facility]   Social Network    Social Network: Not on file     Vital Signs: There were no vitals taken for this visit.  Physical Exam  Patient is alert, oriented and able to participate fully in the conversation. No apparent discomfort or distress observed. She appears appropriately dressed.    Imaging:  Bilateral knee radiographs (05/07/22)  Kellgren and Lawrence Grade 3 bilaterally  Left GAE 05/08/23     Labs:  CBC: No results for input(s): "WBC", "HGB", "HCT", "PLT" in the last 8760 hours.  COAGS: No results for input(s): "INR", "APTT" in the last 8760 hours.  BMP: No results for input(s): "NA", "K", "CL", "CO2", "GLUCOSE", "BUN", "CALCIUM", "CREATININE", "GFRNONAA", "GFRAA" in the last 8760 hours.  Invalid input(s): "CMP"  LIVER FUNCTION TESTS: No results for input(s): "BILITOT", "AST", "ALT", "ALKPHOS", "PROT", "ALBUMIN" in the last 8760 hours.  Assessment and Plan: 68 year old female with a history of advanced bilateral osteoarthritis (K&G 3) with lifestyle-limited, severe chronic bilateral (left greater than right) knee pain (WOMAC  74/96, VAS 9/10).  She underwent a technically successful left geniculate artery embolization 05/08/23.  Her left knee pain and range of motion has significantly improved, now rating it a 3/10.    She would like to have her right knee treated since the left has improved so much.  She is appropriate for this procedure.  Plan for right geniculate artery embolization with moderate sedation via antegrade femoral artery approach at [facility].    Creasie Doctor, MD Pager: [phone]    I spent a total of 25 Minutes in virtual video clinical consultation, greater than 50% of which was counseling/coordinating care for bilateral knee pain.

## 2023-06-08 ENCOUNTER — Ambulatory Visit
Admission: RE | Admit: 2023-06-08 | Discharge: 2023-06-08 | Disposition: A | Discharge status: Home / Self Care | Source: Ambulatory Visit | Attending: Sports Medicine | Admitting: Sports Medicine

## 2023-06-08 ENCOUNTER — Ambulatory Visit: Admitting: Sports Medicine

## 2023-06-08 DIAGNOSIS — M170 Bilateral primary osteoarthritis of knee: Secondary | ICD-10-CM

## 2023-06-08 HISTORY — PX: IR RADIOLOGIST EVAL & MGMT: IMG5224

## 2023-06-09 ENCOUNTER — Other Ambulatory Visit

## 2023-06-09 ENCOUNTER — Other Ambulatory Visit: Payer: Self-pay | Admitting: Interventional Radiology

## 2023-06-09 DIAGNOSIS — M869 Osteomyelitis, unspecified: Secondary | ICD-10-CM

## 2023-06-18 ENCOUNTER — Encounter: Payer: Self-pay | Admitting: Family Medicine

## 2023-07-01 ENCOUNTER — Ambulatory Visit (INDEPENDENT_AMBULATORY_CARE_PROVIDER_SITE_OTHER)

## 2023-07-01 DIAGNOSIS — Z1231 Encounter for screening mammogram for malignant neoplasm of breast: Secondary | ICD-10-CM | POA: Diagnosis not present

## 2023-07-03 ENCOUNTER — Other Ambulatory Visit

## 2023-07-07 ENCOUNTER — Telehealth: Payer: Self-pay

## 2023-07-07 DIAGNOSIS — M170 Bilateral primary osteoarthritis of knee: Secondary | ICD-10-CM

## 2023-07-07 MED ORDER — METHYLPREDNISOLONE 4 MG PO TBPK: ORAL_TABLET | ORAL | 0 refills | Status: DC

## 2023-07-07 NOTE — Discharge Instructions (Signed)
 Discharge Instructions for Genicular Artery Embolization (GAE)     Rest for the remainder of the day.  Avoid strenuous activities and heavy lifting for 48 hours. Gradually resume normal activities as tolerated.   You may experience mild pain or discomfort at the catheter insertion site or in the knee. This is normal. Use over-the-counter pain relievers such as acetaminophen  (Tylenol ) or ibuprofen (Advil) as directed. Apply an ice pack to the knee for 15-20 minutes every 2-3 hours to reduce swelling and discomfort.  Take Medrol  Dosepak as directed per pharmacy.   Keep the catheter insertion site clean and dry. Remove the bandage after 24 hours and replace it with a clean, dry bandage if needed. Avoid soaking in baths, hot tubs, or swimming pools for 5 days. Showers are allowed.   Take any prescribed medications as directed. If you were taking blood thinners, follow your physician's instructions on when to resume them.   Resume your normal diet. Drink plenty of fluids to stay hydrated.   Schedule a follow-up appointment with your physician as instructed.   Call our office at [phone] with any concerns, including if you experience increased pain, swelling, warmth, redness, or drainage at the insertion site, if you have a fever over 100.41F (38C), or if you experience sudden weakness or numbness in the treated leg.   Please ensure you follow these instructions carefully and reach out to your healthcare provider if you have any concerns or questions. Wishing you a smooth and speedy recovery!    If you need to speak to someone after hours 5:00pm, please call the on call the Interventional Radiologist on-call at [phone]. Tell them you are a patient of Dr. Jinx Mourning and you had a GAE today, along with any issues you are having.

## 2023-07-07 NOTE — Telephone Encounter (Signed)
 Spoke with patient to confirm pharmacy and to remind her about her upcoming GAE procedure on 07/10/23. I told her I would send in a prescription for a medrol  dosepak to her [facility]. It is to be taken as prescribed after her GAE procedure. I also reminded her to wear comfortable, loose-fitting pants and shoes and not to have anything to eat or drink after midnight except sips of water to take any medications permitted by Dr. Jinx Mourning.

## 2023-07-09 NOTE — H&P (Signed)
 Chief Complaint: Patient was seen in consultation today for right knee pain   Referring Physician(s): Gean Keels   Supervising Physician: Creasie Doctor  Patient Status: [facility] Beverly Coffey - Outpatient   History of Present Illness: Beverly Coffey is a 68 y.o. female with a medical history significant for anxiety and osteoarthritis with chronic bilateral knee pain. She had several rounds of steroid injections with the last set of injections in mid-July 2024. She did not get acceptable relief from these and Dr. Sandy Crumb did not think additional steroid injections would be beneficial. She unfortunately was not approved for viscosupplementation. In addition to knee pain she complained of some locking and she was unable to flex her knees past about 90 degrees.    The patient was referred for consultation with orthopedic surgery and she was also referred to Interventional Radiology for evaluation of geniculate artery embolization. She first met with Dr. Jinx Mourning 12/26/22 and she stated her left knee pain was worse than the right. About 15 years ago she had meniscal repair on the right knee. The pain significantly affects her mobility.     They discussed the rationale, periprocedural expectations including risks and benefits of geniculate artery embolization. She wished to proceed and they discussed starting with the left knee. If she responded well to the left GAE they could consider also treating the right knee. Dr. Sandy Crumb ordered a left knee MRI without contrast and this showed two tears in the medial meniscus, peripheral meniscal extrusion with degeneration and tricompartmental cartilage abnormalities. She underwent a technically successful left geniculate artery embolization 05/08/23 and she followed up with Dr. Jinx Mourning on 06/08/23.   She reported significantly improved range of motion of the left knee. Her pain had decreased from 9/10 to 3/10. She still had some stiffness in the  knee which she was hopeful would improve with time. Her right knee was now bothering her the most. She was using ice packs and meloxicam . Since her left knee had improved so much she requested to have the right knee also treated.    Past Medical History:  Diagnosis Date   Anxiety    Bilateral chronic knee pain    Vitamin D  deficiency     Past Surgical History:  Procedure Laterality Date   IR EMBO ARTERIAL NOT HEMORR HEMANG INC GUIDE ROADMAPPING  05/08/2023   IR RADIOLOGIST EVAL & MGMT  12/26/2022   IR RADIOLOGIST EVAL & MGMT  06/08/2023   KNEE ARTHROSCOPY W/ MENISCAL REPAIR Left     Allergies: Patient has no known allergies.  Medications: Prior to Admission medications   Medication Sig Start Date End Date Taking? Authorizing Provider  Cholecalciferol  20 MCG (800 UNIT) TABS Take 1 tablet by mouth daily. 11/26/22   Josepha Nickels, DO  meloxicam  (MOBIC ) 15 MG tablet One tab PO every 24 hours with a meal for 2 weeks, then once every 24 hours prn pain. 05/04/23   Gean Keels, MD  methylPREDNISolone  (MEDROL  DOSEPAK) 4 MG TBPK tablet Taper per pharmacy instructions. 07/07/23   Suttle, Antonia Battiest, MD  omeprazole  (PRILOSEC) 20 MG capsule Take 1 capsule (20 mg total) by mouth daily. 11/25/22   Josepha Nickels, DO  traMADol  (ULTRAM ) 50 MG tablet Take 1 tablet (50 mg total) by mouth every 8 (eight) hours as needed for moderate pain. 11/18/22   Gean Keels, MD     Family History  Problem Relation Age of Onset   Diabetes Mother    Heart attack Mother  Hypertension Mother    Rheum arthritis Father    Parkinson's disease Father    Prostate cancer Brother     Social History   Socioeconomic History   Marital status: Married    Spouse name: Not on file   Number of children: Not on file   Years of education: Not on file   Highest education level: Not on file  Occupational History   Not on file  Tobacco Use   Smoking status: Never   Smokeless tobacco: Never  Vaping Use    Vaping status: Never Used  Substance and Sexual Activity   Alcohol use: No   Drug use: Never   Sexual activity: Yes    Birth control/protection: Post-menopausal  Other Topics Concern   Not on file  Social History Narrative   Not on file   Social Drivers of Health   Financial Resource Strain: Not on file  Food Insecurity: Not on file  Transportation Needs: Not on file  Physical Activity: Not on file  Stress: Not on file  Social Connections: Unknown (05/23/2022)   Received from [facility]   Social Network    Social Network: Not on file    Review of Systems: A 12 point ROS discussed and pertinent positives are indicated in the HPI above.  All other systems are negative.  Review of Systems  Vital Signs: There were no vitals taken for this visit.  Physical Exam  Imaging:  Bilateral knee radiographs (05/07/22)  Kellgren and Lawrence Grade 3 bilaterally   Left GAE 05/08/23         Labs:  CBC: No results for input(s): "WBC", "HGB", "HCT", "PLT" in the last 8760 hours.  COAGS: No results for input(s): "INR", "APTT" in the last 8760 hours.  BMP: No results for input(s): "NA", "K", "CL", "CO2", "GLUCOSE", "BUN", "CALCIUM", "CREATININE", "GFRNONAA", "GFRAA" in the last 8760 hours.  Invalid input(s): "CMP"  LIVER FUNCTION TESTS: No results for input(s): "BILITOT", "AST", "ALT", "ALKPHOS", "PROT", "ALBUMIN" in the last 8760 hours.  TUMOR MARKERS: No results for input(s): "AFPTM", "CEA", "CA199", "CHROMGRNA" in the last 8760 hours.  Assessment and Plan:  Right knee pain: Beverly Coffey, 68 year old female, presents today for an image-guided right geniculate artery embolization.   Risks and benefits of this procedure were discussed with the patient including, but not limited to bleeding, infection, vascular injury or contrast induced renal failure.  All of the patient's questions were answered, patient is agreeable to proceed. She has been  NPO.   Consent signed and in chart.  Thank you for this interesting consult.  I greatly enjoyed meeting Beverly Coffey and look forward to participating in their care.  A copy of this report was sent to the requesting provider on this date.  Electronically Signed: Jetta Morrow, AGACNP-BC 07/09/2023, 12:41 PM   I spent a total of  30 Minutes   in face to face in clinical consultation, greater than 50% of which was counseling/coordinating care for right knee pain.

## 2023-07-10 ENCOUNTER — Ambulatory Visit
Admission: RE | Admit: 2023-07-10 | Discharge: 2023-07-10 | Disposition: A | Discharge status: Home / Self Care | Source: Ambulatory Visit | Attending: Interventional Radiology | Admitting: Interventional Radiology

## 2023-07-10 DIAGNOSIS — M869 Osteomyelitis, unspecified: Secondary | ICD-10-CM

## 2023-07-10 HISTORY — PX: IR EMBO ARTERIAL NOT HEMORR HEMANG INC GUIDE ROADMAPPING: IMG5448

## 2023-07-10 MED ORDER — NITROGLYCERIN 1 MG/10 ML FOR IR/CATH LAB
100.0000 ug | INTRA_ARTERIAL | Status: DC | PRN
  Administered 2023-07-10: 100 ug via INTRA_ARTERIAL

## 2023-07-10 MED ORDER — ACETAMINOPHEN 10 MG/ML IV SOLN
1000.0000 mg | Freq: Once | INTRAVENOUS | Status: AC
  Administered 2023-07-10: 1000 mg via INTRAVENOUS

## 2023-07-10 MED ORDER — SODIUM CHLORIDE 0.9 % IV SOLN: INTRAVENOUS | Status: DC

## 2023-07-10 MED ORDER — KETOROLAC TROMETHAMINE 30 MG/ML IJ SOLN
30.0000 mg | Freq: Once | INTRAMUSCULAR | Status: AC
  Administered 2023-07-10: 30 mg via INTRAVENOUS

## 2023-07-10 MED ORDER — LIDOCAINE-EPINEPHRINE 1 %-1:100000 IJ SOLN
10.0000 mL | Freq: Once | INTRAMUSCULAR | Status: AC
  Administered 2023-07-10: 10 mL via INTRADERMAL

## 2023-07-10 MED ORDER — DEXAMETHASONE SODIUM PHOSPHATE 10 MG/ML IJ SOLN
10.0000 mg | Freq: Once | INTRAMUSCULAR | Status: AC
  Administered 2023-07-10: 10 mg via INTRAVENOUS

## 2023-07-10 MED ORDER — FENTANYL CITRATE PF 50 MCG/ML IJ SOSY
25.0000 ug | PREFILLED_SYRINGE | INTRAMUSCULAR | Status: DC | PRN
  Administered 2023-07-10 (×2): 50 ug via INTRAVENOUS

## 2023-07-10 MED ORDER — MIDAZOLAM HCL 2 MG/2ML IJ SOLN
1.0000 mg | INTRAMUSCULAR | Status: DC | PRN
  Administered 2023-07-10 (×2): 1 mg via INTRAVENOUS

## 2023-07-10 MED ORDER — IODIXANOL 270 MG/ML IV SOLN
100.0000 mL | Freq: Once | INTRAVENOUS | Status: AC
  Administered 2023-07-10: 45 mL

## 2023-07-10 NOTE — Procedures (Signed)
 Interventional Radiology Procedure Note  Procedure: Right geniculate artery embolization  Findings: Please refer to procedural dictation for full description. Right proximal SFA 4 Fr access.  Complications: None immediate  Estimated Blood Loss: < 5 mL  Recommendations: IR will arrange 1 month outpatient follow up.   Creasie Doctor, MD

## 2023-07-12 ENCOUNTER — Ambulatory Visit: Payer: Self-pay | Admitting: Family Medicine

## 2023-07-12 NOTE — Progress Notes (Signed)
 Please call patient. Normal mammogram.  Repeat in 1 year.

## 2023-07-17 ENCOUNTER — Telehealth: Payer: Self-pay

## 2023-07-17 NOTE — Telephone Encounter (Signed)
 Left message for a return call

## 2023-07-17 NOTE — Telephone Encounter (Signed)
 Copied from CRM [id number]. Topic: Clinical - Medical Advice >> Jul 17, 2023  9:38 AM Shelby Dessert H wrote: Reason for CRM: Patient was calling to verify if she needs to fast or not. I told her yes but I also told her that I was not 100% sure and I would have a nurse reach out to her for clarification. Can you assist with this? Her callback number is [phone]

## 2023-07-20 ENCOUNTER — Encounter: Admitting: Family Medicine

## 2023-07-27 ENCOUNTER — Encounter: Admitting: Family Medicine

## 2023-07-29 ENCOUNTER — Encounter: Payer: Self-pay | Admitting: Urgent Care

## 2023-07-29 ENCOUNTER — Ambulatory Visit: Admitting: Urgent Care

## 2023-07-29 VITALS — BP 125/78 | HR 78 | Resp 18 | Ht 61.0 in | Wt 183.2 lb

## 2023-07-29 DIAGNOSIS — E559 Vitamin D deficiency, unspecified: Secondary | ICD-10-CM | POA: Diagnosis not present

## 2023-07-29 DIAGNOSIS — K219 Gastro-esophageal reflux disease without esophagitis: Secondary | ICD-10-CM

## 2023-07-29 DIAGNOSIS — Z23 Encounter for immunization: Secondary | ICD-10-CM | POA: Diagnosis not present

## 2023-07-29 DIAGNOSIS — Z6834 Body mass index (BMI) 34.0-34.9, adult: Secondary | ICD-10-CM

## 2023-07-29 DIAGNOSIS — M170 Bilateral primary osteoarthritis of knee: Secondary | ICD-10-CM

## 2023-07-29 DIAGNOSIS — E782 Mixed hyperlipidemia: Secondary | ICD-10-CM | POA: Diagnosis not present

## 2023-07-29 DIAGNOSIS — Z79899 Other long term (current) drug therapy: Secondary | ICD-10-CM

## 2023-07-29 NOTE — Progress Notes (Signed)
 Established Patient Office Visit  Subjective:  Patient ID: Beverly Coffey, female    DOB: [birth year]  Age: 68 y.o. MRN: [id number]  Chief Complaint  Patient presents with   Annual Exam    HPI  Discussed the use of AI scribe software for clinical note transcription with the patient, who gave verbal consent to proceed.  History of Present Illness   Beverly Coffey is a 68 year old female with osteopenia and knee pain who presents for a routine follow-up visit.  She recently underwent genicular artery embolization (GAE) for both knees, with the left knee procedure done earlier and the right knee procedure completed two weeks ago. She has experienced improved range of motion but continues to have stiffness in the left knee and pain during transitions from sitting to standing. She takes meloxicam  as needed for pain, which she has been using more frequently since the procedures. She completed a Medrol  Dosepak post-procedure, which was effective, but she is no longer taking it. She previously used tramadol  but has discontinued it.  She has a history of dense breast tissue and has had cysts in the past that resolved spontaneously. Her recent mammogram on Jul 01, 2023, was normal. She uses MyChart for communication and is comfortable with this system.  She has been taking omeprazole  daily for the past eight months for acid reflux, which has resolved her symptoms. She also takes vitamin D  and calcium supplements due to osteopenia, with her last bone density test showing osteopenic range in the femur and spine but normal in the forearm.  No history of blood sugar issues, with her last glucose levels being normal. She experiences anxiety, particularly on highways and in stressful situations, but does not drive herself. She reports some swelling in her ankles, which she attributes to her knee issues, and notes that elevation does not improve the swelling.      Patient Active Problem List   Diagnosis  Date Noted   Gastroesophageal reflux disease without esophagitis 11/25/2022   Esophageal dysphagia 11/25/2022   Vitamin D  deficiency 11/25/2022   Other fatigue 11/25/2022   Mixed hyperlipidemia 04/28/2022   Routine adult health maintenance 04/28/2022   Primary osteoarthritis of both knees 09/13/2018   Left breast mass 04/04/2013   Myalgia and myositis, unspecified 03/16/2013   CERVICAL STRAIN 04/16/2009   Past Medical History:  Diagnosis Date   Anxiety    Bilateral chronic knee pain    Vitamin D  deficiency    Past Surgical History:  Procedure Laterality Date   IR EMBO ARTERIAL NOT HEMORR HEMANG INC GUIDE ROADMAPPING  05/08/2023   IR EMBO ARTERIAL NOT HEMORR HEMANG INC GUIDE ROADMAPPING  07/10/2023   IR RADIOLOGIST EVAL & MGMT  12/26/2022   IR RADIOLOGIST EVAL & MGMT  06/08/2023   KNEE ARTHROSCOPY W/ MENISCAL REPAIR Left    Social History   Tobacco Use   Smoking status: Never   Smokeless tobacco: Never  Vaping Use   Vaping status: Never Used  Substance Use Topics   Alcohol use: No   Drug use: Never      ROS: as noted in HPI  Objective:     BP 125/78 (BP Location: Left Arm, Patient Position: Sitting, Cuff Size: Normal)   Pulse 78   Resp 18   Ht 5' 1 (1.549 m)   Wt 183 lb 4 oz (83.1 kg)   SpO2 100%   BMI 34.62 kg/m  BP Readings from Last 3 Encounters:  07/29/23 125/78  07/10/23 (!) 165/80  05/08/23 121/79   Wt Readings from Last 3 Encounters:  07/29/23 183 lb 4 oz (83.1 kg)  11/25/22 181 lb (82.1 kg)  04/28/22 170 lb (77.1 kg)      Physical Exam Vitals and nursing note reviewed. Exam conducted with a chaperone present.  Constitutional:      General: She is not in acute distress.    Appearance: Normal appearance. She is not ill-appearing, toxic-appearing or diaphoretic.  HENT:     Head: Normocephalic and atraumatic.     Right Ear: Tympanic membrane, ear canal and external ear normal. There is no impacted cerumen.     Left Ear: Tympanic membrane, ear  canal and external ear normal. There is no impacted cerumen.     Nose: Nose normal.     Mouth/Throat:     Mouth: Mucous membranes are moist.     Pharynx: Oropharynx is clear. No oropharyngeal exudate or posterior oropharyngeal erythema.   Eyes:     General: No scleral icterus.       Right eye: No discharge.        Left eye: No discharge.     Extraocular Movements: Extraocular movements intact.     Pupils: Pupils are equal, round, and reactive to light.   Neck:     Thyroid : No thyroid  mass, thyromegaly or thyroid  tenderness.   Cardiovascular:     Rate and Rhythm: Normal rate and regular rhythm.     Pulses: Normal pulses.     Heart sounds: No murmur heard. Pulmonary:     Effort: Pulmonary effort is normal. No respiratory distress.     Breath sounds: Normal breath sounds. No stridor. No wheezing or rhonchi.  Abdominal:     General: Bowel sounds are normal.   Musculoskeletal:     Cervical back: Normal range of motion and neck supple. No rigidity or tenderness.  Lymphadenopathy:     Cervical: No cervical adenopathy.   Skin:    General: Skin is warm and dry.     Coloration: Skin is not jaundiced.     Findings: No bruising, erythema or rash.   Neurological:     General: No focal deficit present.     Mental Status: She is alert and oriented to person, place, and time.     Sensory: No sensory deficit.     Motor: No weakness.   Psychiatric:        Mood and Affect: Mood normal.        Behavior: Behavior normal.      No results found for any visits on 07/29/23.  Last CBC Lab Results  Component Value Date   WBC 6.3 04/28/2022   HGB 13.7 04/28/2022   HCT 40.9 04/28/2022   MCV 84.3 04/28/2022   MCH 28.2 04/28/2022   RDW 11.9 04/28/2022   PLT 308 04/28/2022   Last metabolic panel Lab Results  Component Value Date   GLUCOSE 82 04/28/2022   NA 143 04/28/2022   K 4.3 04/28/2022   CL 108 04/28/2022   CO2 26 04/28/2022   BUN 23 04/28/2022   CREATININE 0.59 04/28/2022    EGFR 101 04/28/2022   CALCIUM 9.3 04/28/2022   PROT 7.3 04/28/2022   ALBUMIN 4.4 03/14/2013   BILITOT 0.5 04/28/2022   ALKPHOS 71 03/14/2013   AST 25 04/28/2022   ALT 24 04/28/2022   Last lipids Lab Results  Component Value Date   CHOL 198 04/28/2022   HDL 56 04/28/2022   LDLCALC 119 (H) 04/28/2022  TRIG 118 04/28/2022   CHOLHDL 3.5 04/28/2022   Last hemoglobin A1c Lab Results  Component Value Date   HGBA1C 5.2 09/21/2018   Last thyroid  functions Lab Results  Component Value Date   TSH 0.961 11/25/2022   Last vitamin D  Lab Results  Component Value Date   VD25OH 16.1 (L) 11/25/2022   Last vitamin B12 and Folate Lab Results  Component Value Date   VITAMINB12 324 11/25/2022      The 10-year ASCVD risk score (Arnett DK, et al., 2019) is: 5.3%  Assessment & Plan:  Primary osteoarthritis of both knees  Vitamin D  deficiency -     VITAMIN D  25 Hydroxy (Vit-D Deficiency, Fractures)  Gastroesophageal reflux disease without esophagitis -     Lipid panel  BMI 34.0-34.9,adult -     Lipid panel -     Hemoglobin A1c  Long-term use of high-risk medication -     CBC -     Lipid panel -     VITAMIN D  25 Hydroxy (Vit-D Deficiency, Fractures) -     CMP14+EGFR -     TSH -     Hemoglobin A1c  Mixed hyperlipidemia  Need for vaccination against Streptococcus pneumoniae -     Pneumococcal conjugate vaccine 20-valent  Assessment and Plan    Knee Osteoarthritis Post-GAE, improved range of motion but persistent stiffness and pain, especially in the left knee. Meloxicam  used for pain management. Aquatic therapy and Epsom salt baths recommended for muscle relaxation. - Continue meloxicam  as needed for pain management. - Monitor kidney and liver function if meloxicam  is taken daily. - Encourage aquatic therapy and Epsom salt baths.  Osteopenia DEXA scan shows osteopenia in femur and spine. Vitamin D  and calcium supplements taken. Discussed bone rebuilding options,  considering Prolia due to GERD concerns. Emphasized weight-bearing exercises. - Continue vitamin D  and calcium supplementation. - Encourage weight-bearing exercises. - Consider Prolia infusions if needed. - Repeat DEXA scan in two years.  Gastroesophageal Reflux Disease (GERD) Omeprazole  effectively managed symptoms with no reflux episodes. - Continue omeprazole  daily.  General Health Maintenance Due for pneumococcal vaccine, Shingrix  series completed. - Administer pneumococcal vaccine today.  Follow-up Routine follow-up and monitoring of conditions discussed. - Perform CMP and CBC to monitor kidney and liver function due to meloxicam  use. - Check A1c to assess for prediabetes. - Schedule annual follow-up visit.         Return in about 1 year (around 07/28/2024).   Mandy Second, PA

## 2023-07-29 NOTE — Patient Instructions (Signed)
 Please read the attached handout regarding osteopenia. Please perform weight lifting activities to help prevent further reduction of bone mineralization.  Look into doing water aerobics to help with movement without causing worsening knee pain.  Please return in one year for annual physical.

## 2023-07-30 ENCOUNTER — Ambulatory Visit: Payer: Self-pay | Admitting: Urgent Care

## 2023-07-30 ENCOUNTER — Encounter: Payer: Self-pay | Admitting: Urgent Care

## 2023-07-30 DIAGNOSIS — E559 Vitamin D deficiency, unspecified: Secondary | ICD-10-CM

## 2023-07-30 LAB — TSH: TSH: 0.712 u[IU]/mL (ref 0.450–4.500)

## 2023-07-30 LAB — LIPID PANEL
Chol/HDL Ratio: 3.7 ratio (ref 0.0–4.4)
Cholesterol, Total: 220 mg/dL — ABNORMAL HIGH (ref 100–199)
HDL: 59 mg/dL (ref >39)
LDL Chol Calc (NIH): 143 mg/dL — ABNORMAL HIGH (ref 0–99)
Triglycerides: 99 mg/dL (ref 0–149)
VLDL Cholesterol Cal: 18 mg/dL (ref 5–40)

## 2023-07-30 LAB — HEMOGLOBIN A1C
Est. average glucose Bld gHb Est-mCnc: 111 mg/dL
Hgb A1c MFr Bld: 5.5 % (ref 4.8–5.6)

## 2023-07-30 LAB — CMP14+EGFR
ALT: 25 IU/L (ref 0–32)
AST: 20 IU/L (ref 0–40)
Albumin: 4.4 g/dL (ref 3.9–4.9)
Alkaline Phosphatase: 96 IU/L (ref 44–121)
BUN/Creatinine Ratio: 38 — ABNORMAL HIGH (ref 12–28)
BUN: 26 mg/dL (ref 8–27)
Bilirubin Total: 0.7 mg/dL (ref 0.0–1.2)
CO2: 20 mmol/L (ref 20–29)
Calcium: 9.3 mg/dL (ref 8.7–10.3)
Chloride: 107 mmol/L — ABNORMAL HIGH (ref 96–106)
Creatinine, Ser: 0.68 mg/dL (ref 0.57–1.00)
Globulin, Total: 2.3 g/dL (ref 1.5–4.5)
Glucose: 82 mg/dL (ref 70–99)
Potassium: 4.2 mmol/L (ref 3.5–5.2)
Sodium: 143 mmol/L (ref 134–144)
Total Protein: 6.7 g/dL (ref 6.0–8.5)
eGFR: 97 mL/min/{1.73_m2} (ref >59)

## 2023-07-30 LAB — CBC
Hematocrit: 39.7 % (ref 34.0–46.6)
Hemoglobin: 12.9 g/dL (ref 11.1–15.9)
MCH: 28.3 pg (ref 26.6–33.0)
MCHC: 32.5 g/dL (ref 31.5–35.7)
MCV: 87 fL (ref 79–97)
Platelets: 312 10*3/uL (ref 150–450)
RBC: 4.56 x10E6/uL (ref 3.77–5.28)
RDW: 12.4 % (ref 11.7–15.4)
WBC: 5.6 10*3/uL (ref 3.4–10.8)

## 2023-07-30 LAB — VITAMIN D 25 HYDROXY (VIT D DEFICIENCY, FRACTURES): Vit D, 25-Hydroxy: 28.1 ng/mL — ABNORMAL LOW (ref 30.0–100.0)

## 2023-07-30 MED ORDER — VITAMIN D (ERGOCALCIFEROL) 1.25 MG (50000 UNIT) PO CAPS: 50000.0000 [IU] | ORAL_CAPSULE | ORAL | 0 refills | Status: AC

## 2023-08-03 ENCOUNTER — Other Ambulatory Visit: Payer: Self-pay | Admitting: Interventional Radiology

## 2023-08-03 DIAGNOSIS — M25561 Pain in right knee: Secondary | ICD-10-CM

## 2023-08-11 NOTE — Progress Notes (Signed)
 This encounter was conducted via the [employer] providing interactive audio and visual communication.  The patient provided verbal consent to conduct a virtual appointment.  The patient was located at their primary residence during this encounter.  Referring Physician(s): Curtis Debby PARAS   Chief Complaint: The patient is seen in virtual video follow up today s/p left geniculate artery embolization 05/08/23 and right geniculate artery embolization 07/10/23  History of present illness: HPI from last clinic visit 06/08/23 Beverly Coffey is a 68 y.o. female with a medical history significant for anxiety and osteoarthritis with chronic bilateral knee pain. She has had several rounds of steroid injections with the last set of injections in mid-July. She did not get acceptable relief from these and Dr. Curtis does not think additional steroid injections will be beneficial. She unfortunately was not approved for viscosupplementation. In addition to knee pain she complains of some locking and she is unable to flex her knees past about 90 degrees. Dr.Thekkekandam has ordered an MRI of her left knee.    The patient has been referred for consultation with orthopedic surgery and she's also been referred to our team for evaluation of geniculate artery embolization. She presents today via virtual tele-health visit for further discussion.  Her left is worse than the right.  About 15 years ago she had meniscal repair on the right knee.  The pain significantly affects her mobility.  She is averse to having TKA and wants to wait as long as possible.    WOMAC Knee Pain Score: 74/96 VAS Knee Pain Score:  9/10   We discussed the rationale, periprocedural expectations including risks and benefits of geniculate artery embolization. She wished to proceed and we discussed starting with the left knee. If she responds well to the left GAE we can consider also treating the right knee. Dr. Curtis  ordered a left knee MRI without contrast and this showed two tears in the medial meniscus, peripheral meniscal extrusion with degeneration and tricompartmental cartilage abnormalities. She underwent a technically successful left geniculate artery embolization 05/08/23 and she presents today for follow up via virtual video visit.    She reported a significantly improved range of motion of the left knee and her pain had decreased from 9/10 to 3/10. She still had some stiffness in the knee which she was hopeful would improve over time. Her right knee now bothered her the most and we discussed treating this knee with geniculate artery embolization. She was agreeable to proceed and this was performed 07/10/23. She tolerated the procedure well and was discharged home the same day.  She presents today for follow up via virtual video visit.   Past Medical History:  Diagnosis Date   Anxiety    Bilateral chronic knee pain    Vitamin D  deficiency     Past Surgical History:  Procedure Laterality Date   IR EMBO ARTERIAL NOT HEMORR HEMANG INC GUIDE ROADMAPPING  05/08/2023   IR EMBO ARTERIAL NOT HEMORR HEMANG INC GUIDE ROADMAPPING  07/10/2023   IR RADIOLOGIST EVAL & MGMT  12/26/2022   IR RADIOLOGIST EVAL & MGMT  06/08/2023   KNEE ARTHROSCOPY W/ MENISCAL REPAIR Left     Allergies: Patient has no known allergies.  Medications: Prior to Admission medications   Medication Sig Start Date End Date Taking? Authorizing Provider  Cholecalciferol  20 MCG (800 UNIT) TABS Take 1 tablet by mouth daily. 11/26/22   Bevin Bernice RAMAN, DO  meloxicam  (MOBIC ) 15 MG tablet One tab PO every 24 hours  with a meal for 2 weeks, then once every 24 hours prn pain. 05/04/23   Curtis Debby PARAS, MD  omeprazole  (PRILOSEC) 20 MG capsule Take 1 capsule (20 mg total) by mouth daily. 11/25/22   Bevin Bernice RAMAN, DO  Vitamin D , Ergocalciferol , (DRISDOL ) 1.25 MG (50000 UNIT) CAPS capsule Take 1 capsule (50,000 Units total) by mouth every 7  (seven) days. 07/30/23   Lowella Benton CROME, PA     Family History  Problem Relation Age of Onset   Diabetes Mother    Heart attack Mother    Hypertension Mother    Rheum arthritis Father    Parkinson's disease Father    Prostate cancer Brother     Social History   Socioeconomic History   Marital status: Married    Spouse name: Not on file   Number of children: Not on file   Years of education: Not on file   Highest education level: 12th grade  Occupational History   Not on file  Tobacco Use   Smoking status: Never   Smokeless tobacco: Never  Vaping Use   Vaping status: Never Used  Substance and Sexual Activity   Alcohol use: No   Drug use: Never   Sexual activity: Yes    Birth control/protection: Post-menopausal  Other Topics Concern   Not on file  Social History Narrative   Not on file   Social Drivers of Health   Financial Resource Strain: Low Risk  (07/28/2023)   Overall Financial Resource Strain (CARDIA)    Difficulty of Paying Living Expenses: Not very hard  Food Insecurity: No Food Insecurity (07/28/2023)   Hunger Vital Sign    Worried About Running Out of Food in the Last Year: Never true    Ran Out of Food in the Last Year: Never true  Transportation Needs: No Transportation Needs (07/28/2023)   PRAPARE - Administrator, Civil Service (Medical): No    Lack of Transportation (Non-Medical): No  Physical Activity: Insufficiently Active (07/28/2023)   Exercise Vital Sign    Days of Exercise per Week: 2 days    Minutes of Exercise per Session: 20 min  Stress: No Stress Concern Present (07/28/2023)   [employer] of Occupational Health - Occupational Stress Questionnaire    Feeling of Stress: Only a little  Social Connections: Socially Integrated (07/28/2023)   Social Connection and Isolation Panel    Frequency of Communication with Friends and Family: More than three times a week    Frequency of Social Gatherings with Friends and Family: Once a  week    Attends Religious Services: More than 4 times per year    Active Member of [employer] or Organizations: Yes    Attends Engineer, structural: More than 4 times per year    Marital Status: Married     Vital Signs: There were no vitals taken for this visit.  Physical Exam  Patient is alert, oriented and able to participate fully in the conversation. No apparent discomfort or distress observed. She appears appropriately dressed.    Imaging: Bilateral knee radiographs (05/07/22)  Kellgren and Lawrence Grade 3 bilaterally   Left GAE 05/08/23           Right GAE 07/10/23      Labs:  CBC: Recent Labs    07/29/23 1149  WBC 5.6  HGB 12.9  HCT 39.7  PLT 312    COAGS: No results for input(s): INR, APTT in the last 8760 hours.  BMP:  Recent Labs    07/29/23 1149  NA 143  K 4.2  CL 107*  CO2 20  GLUCOSE 82  BUN 26  CALCIUM 9.3  CREATININE 0.68    LIVER FUNCTION TESTS: Recent Labs    07/29/23 1149  BILITOT 0.7  AST 20  ALT 25  ALKPHOS 96  PROT 6.7  ALBUMIN 4.4    Assessment and Plan:  68 year old female with a history of bilateral knee pain now s/p bilateral geniculate artery embolizations. The left was treated 05/08/23 and the right was treated 07/10/23.   Ester Sides, MD Pager: [phone]    I spent a total of 25 Minutes in virtual clinical consultation, greater than 50% of which was counseling/coordinating care for bilateral knee pain.

## 2023-08-12 ENCOUNTER — Ambulatory Visit
Admission: RE | Admit: 2023-08-12 | Discharge: 2023-08-12 | Disposition: A | Discharge status: Home / Self Care | Source: Ambulatory Visit | Attending: Interventional Radiology | Admitting: Interventional Radiology

## 2023-08-12 DIAGNOSIS — M25561 Pain in right knee: Secondary | ICD-10-CM

## 2023-08-12 HISTORY — PX: IR RADIOLOGIST EVAL & MGMT: IMG5224

## 2023-08-26 ENCOUNTER — Other Ambulatory Visit: Payer: Self-pay | Admitting: Sports Medicine

## 2023-08-26 DIAGNOSIS — M170 Bilateral primary osteoarthritis of knee: Secondary | ICD-10-CM

## 2023-09-08 ENCOUNTER — Ambulatory Visit

## 2023-09-08 ENCOUNTER — Ambulatory Visit: Admitting: Sports Medicine

## 2023-09-08 ENCOUNTER — Other Ambulatory Visit (INDEPENDENT_AMBULATORY_CARE_PROVIDER_SITE_OTHER)

## 2023-09-08 DIAGNOSIS — M170 Bilateral primary osteoarthritis of knee: Secondary | ICD-10-CM

## 2023-09-08 DIAGNOSIS — M25512 Pain in left shoulder: Secondary | ICD-10-CM | POA: Diagnosis not present

## 2023-09-08 DIAGNOSIS — M75102 Unspecified rotator cuff tear or rupture of left shoulder, not specified as traumatic: Secondary | ICD-10-CM | POA: Insufficient documentation

## 2023-09-08 MED ORDER — TRIAMCINOLONE ACETONIDE 40 MG/ML IJ SUSP
80.0000 mg | Freq: Once | INTRAMUSCULAR | Status: AC
  Administered 2023-09-08: 80 mg via INTRA_ARTICULAR

## 2023-09-08 NOTE — Assessment & Plan Note (Signed)
 A month of pain left shoulder localized over the deltoid worse with internal rotation, she does have positive impingement signs today as well as weakness to abduction, positive Neer's, Hawkins, empty can signs, suspect rotator cuff tearing, adding x-rays, MRI, formal PT. Return to see me in 6 weeks.

## 2023-09-08 NOTE — Progress Notes (Signed)
    Procedures performed today:    Procedure: Real-time Ultrasound Guided injection of the left knee Device: Samsung HS60  Verbal informed consent obtained.  Time-out conducted.  Noted no overlying erythema, induration, or other signs of local infection.  Skin prepped in a sterile fashion.  Local anesthesia: Topical Ethyl chloride.  With sterile technique and under real time ultrasound guidance: Effusion noted, 1 cc Kenalog  40, 2 cc lidocaine , 2 cc bupivacaine injected easily Completed without difficulty  Advised to call if fevers/chills, erythema, induration, drainage, or persistent bleeding.  Images permanently stored and available for review in PACS.  Impression: Technically successful ultrasound guided injection.  Procedure: Real-time Ultrasound Guided injection of the right knee Device: Samsung HS60  Verbal informed consent obtained.  Time-out conducted.  Noted no overlying erythema, induration, or other signs of local infection.  Skin prepped in a sterile fashion.  Local anesthesia: Topical Ethyl chloride.  With sterile technique and under real time ultrasound guidance: Effusion noted, 1 cc Kenalog  40, 2 cc lidocaine , 2 cc bupivacaine injected easily Completed without difficulty  Advised to call if fevers/chills, erythema, induration, drainage, or persistent bleeding.  Images permanently stored and available for review in PACS.  Impression: Technically successful ultrasound guided injection.  Independent interpretation of notes and tests performed by another provider:   None.  Brief History, Exam, Impression, and Recommendations:    Primary osteoarthritis of both knees This is a very pleasant 68 year old female with known bilateral knee osteoarthritis, she has had steroid injections the last of which was in 2024, she did not get approved for viscosupplementation, we did set her up for geniculate artery embolization, she has done it and this improved her pain and range of  motion significantly. She has a bit of stiffness and discomfort, we did bilateral steroid injections today. I would like her to wear some compression stockings and do some physical therapy. Return to see me in 6 weeks.  Rotator cuff tear, left A month of pain left shoulder localized over the deltoid worse with internal rotation, she does have positive impingement signs today as well as weakness to abduction, positive Neer's, Hawkins, empty can signs, suspect rotator cuff tearing, adding x-rays, MRI, formal PT. Return to see me in 6 weeks.    ____________________________________________ Beverly Coffey, M.D., ABFM., CAQSM., AME. Primary Care and Sports Medicine [facility]  Adjunct Professor of [facility]  [employer]  Restaurant manager, fast food

## 2023-09-08 NOTE — Addendum Note (Signed)
 Addended by: OLEY ROBIN L on: 09/08/2023 11:20 AM   Modules accepted: Orders

## 2023-09-08 NOTE — Assessment & Plan Note (Signed)
 This is a very pleasant 68 year old female with known bilateral knee osteoarthritis, she has had steroid injections the last of which was in 2024, she did not get approved for viscosupplementation, we did set her up for geniculate artery embolization, she has done it and this improved her pain and range of motion significantly. She has a bit of stiffness and discomfort, we did bilateral steroid injections today. I would like her to wear some compression stockings and do some physical therapy. Return to see me in 6 weeks.

## 2023-09-13 ENCOUNTER — Ambulatory Visit: Payer: Self-pay | Admitting: Sports Medicine

## 2023-09-15 ENCOUNTER — Telehealth: Payer: Self-pay

## 2023-09-15 NOTE — Telephone Encounter (Signed)
 Copied from CRM #[id number]. Topic: General - Other >> Sep 14, 2023  4:57 PM Adrianna P wrote: Reason for CRM: Patient wants to know if she will be called to make the mri appt or does she need to make the appt herself. Please call the patient

## 2023-09-15 NOTE — Telephone Encounter (Signed)
 Patient sent response via Mychart message.

## 2023-10-13 ENCOUNTER — Other Ambulatory Visit: Payer: Self-pay

## 2023-10-13 ENCOUNTER — Encounter: Payer: Self-pay | Admitting: Sports Medicine

## 2023-10-13 DIAGNOSIS — M170 Bilateral primary osteoarthritis of knee: Secondary | ICD-10-CM

## 2023-10-13 MED ORDER — MELOXICAM 15 MG PO TABS: ORAL_TABLET | ORAL | 0 refills | Status: DC

## 2023-10-17 ENCOUNTER — Other Ambulatory Visit: Payer: Self-pay | Admitting: Urgent Care

## 2023-10-20 ENCOUNTER — Ambulatory Visit: Admitting: Sports Medicine

## 2023-10-22 ENCOUNTER — Other Ambulatory Visit

## 2023-10-26 ENCOUNTER — Other Ambulatory Visit

## 2023-10-28 ENCOUNTER — Ambulatory Visit
Admission: RE | Admit: 2023-10-28 | Discharge: 2023-10-28 | Disposition: A | Discharge status: Home / Self Care | Source: Ambulatory Visit | Attending: Family Medicine | Admitting: Family Medicine

## 2023-10-28 DIAGNOSIS — M75102 Unspecified rotator cuff tear or rupture of left shoulder, not specified as traumatic: Secondary | ICD-10-CM

## 2023-10-30 NOTE — Progress Notes (Signed)
 Hi Beverly Coffey, I am reviewing your MRI it does show a little cyst in the humerus bone these are not usually concerning.  Most likely benign.  They did see an inflammation in the insertional tendon of the supraspinatus tendon as well as some partial tearing of that tendon.  It is not completely torn again just a partial tear as well as some inflammation.  There is also some moderate inflammation of the biceps tendon inside the joint space.  Next up would be to do formal physical therapy.  That would be the recommendation before any kind of surgical intervention.  I see that Dr. ONEIDA had placed a referral back in July but I was not sure if you have been going to PT.  I do not see any records i in your chart.  But that would be the recommendation again would be to start physical therapy.

## 2023-11-02 DIAGNOSIS — M75102 Unspecified rotator cuff tear or rupture of left shoulder, not specified as traumatic: Secondary | ICD-10-CM

## 2023-11-03 ENCOUNTER — Ambulatory Visit: Admitting: Urgent Care

## 2023-11-03 ENCOUNTER — Encounter: Payer: Self-pay | Admitting: Urgent Care

## 2023-11-03 VITALS — BP 133/75 | HR 68 | Ht 61.0 in | Wt 187.0 lb

## 2023-11-03 DIAGNOSIS — M8589 Other specified disorders of bone density and structure, multiple sites: Secondary | ICD-10-CM

## 2023-11-03 DIAGNOSIS — B372 Candidiasis of skin and nail: Secondary | ICD-10-CM | POA: Diagnosis not present

## 2023-11-03 DIAGNOSIS — J3489 Other specified disorders of nose and nasal sinuses: Secondary | ICD-10-CM | POA: Diagnosis not present

## 2023-11-03 DIAGNOSIS — Z23 Encounter for immunization: Secondary | ICD-10-CM | POA: Diagnosis not present

## 2023-11-03 DIAGNOSIS — M75102 Unspecified rotator cuff tear or rupture of left shoulder, not specified as traumatic: Secondary | ICD-10-CM

## 2023-11-03 DIAGNOSIS — M7522 Bicipital tendinitis, left shoulder: Secondary | ICD-10-CM

## 2023-11-03 MED ORDER — DICLOFENAC SODIUM 75 MG PO TBEC: 75.0000 mg | DELAYED_RELEASE_TABLET | Freq: Two times a day (BID) | ORAL | 0 refills | Status: AC

## 2023-11-03 MED ORDER — NYSTATIN 100000 UNIT/GM EX POWD: 1.0000 | Freq: Three times a day (TID) | CUTANEOUS | 0 refills | Status: AC

## 2023-11-03 MED ORDER — DICLOFENAC SODIUM 1 % EX GEL
2.0000 g | Freq: Four times a day (QID) | CUTANEOUS | 11 refills | Status: AC
Start: 2023-11-03 — End: ?

## 2023-11-03 MED ORDER — MUPIROCIN 2 % EX OINT: TOPICAL_OINTMENT | CUTANEOUS | 3 refills | Status: AC

## 2023-11-03 NOTE — Progress Notes (Unsigned)
   Established Patient Office Visit  Subjective:  Patient ID: Karsten Vaughn, female    DOB: 07-Jan-1956  Age: 68 y.o. MRN: [id number]  Chief Complaint  Patient presents with  . Follow-up    MRI results    HPI  {History (Optional):23778}  ROS: as noted in HPI  Objective:     BP 133/75   Pulse 68   Ht 5' 1 (1.549 m)   Wt 187 lb (84.8 kg)   SpO2 98%   BMI 35.33 kg/m  {Vitals History (Optional):23777}  Physical Exam   No results found for any visits on 11/03/23.  {Labs (Optional):23779}  The 10-year ASCVD risk score (Arnett DK, et al., 2019) is: 6.8%  Assessment & Plan:  Tear of left supraspinatus tendon -     Ambulatory referral to Physical Therapy -     Ambulatory referral to Sports Medicine  Biceps tendinitis of left upper extremity -     Ambulatory referral to Physical Therapy -     Ambulatory referral to Sports Medicine     No follow-ups on file.   Benton LITTIE Gave, PA

## 2023-11-03 NOTE — Patient Instructions (Addendum)
 Stop your meloxicam  for 3 weeks.  Start diclofenac  twice daily x 2-3 weeks. Take with food. You can also apply topical diclofenac  gel to your shoulder up to four times daily.  Apply the topical mupirocin  ointment to your nares 2-3 x/ day. Also use topical vaseline to provide moisturization.  Calcium 600mg  with Vit D 400-800 units - take twice daily.   Follow up with PT and sports medicine.

## 2023-11-04 ENCOUNTER — Encounter: Payer: Self-pay | Admitting: Urgent Care

## 2023-11-17 NOTE — Progress Notes (Unsigned)
 [facility] Coffey [facility] [address] [place] [address] Phone: [phone]   Assessment and Plan:     1. Chronic left shoulder pain (Primary) 2. Rotator cuff tendinitis, left -Chronic with exacerbation, initial sports medicine visit - 3+ months of left shoulder pain consistent with supraspinatus tendinitis with mild partial tearing. - Reviewed patient's left shoulder MRI from 10/28/2023 which showed mild insertional tendinopathy of supraspinatus with partial tearing.  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  Tolerated well per note below - Recommend starting physical therapy - Patient may complete third and final week of diclofenac  and then discontinue regular NSAID use  Procedure: Subacromial Injection Side: Left  Risks explained and consent was given verbally. The site was cleaned with alcohol prep. A steroid injection was performed from posterior approach using 2mL of 1% lidocaine  without epinephrine  and 1mL of kenalog  40mg /ml. This was well tolerated.  Needle was removed, hemostasis achieved, and post injection instructions were explained.   Pt was advised to call or return to clinic if these symptoms worsen or fail to improve as anticipated.   3. Bilateral primary osteoarthritis of knee 4. Bilateral chronic knee pain 5. Chronic bilateral low back pain without sciatica -Chronic with exacerbation, initial visit - History of bilateral knee pain consistent with flare of osteoarthritis, and flare of low back pain likely due to compensation from knee arthritis - Patient has received relief with intra-articular CSI in the past with last performed on 09/08/2023, however patient only received 2 to 3 months relief.  I believe patient would receive longer and more complete relief with Zilretta  injections.  Will  order prior authorization for bilateral Zilretta  injections at today's visit and could consider injections at follow-up visit - If no significant improvement in 1 to 2 weeks, patient may call and ask for prednisone Dosepak  - Patient may complete third and final week of diclofenac  and then discontinue regular NSAID use  Pertinent previous records reviewed include MRI left shoulder 10/28/2023, procedure note 09/08/23   Follow Up: 6 weeks for reevaluation.  Could consider bilateral Zilretta  injections if approved.  If not improved, could consider bilateral CSI.  Patient can call if pain is not significantly reduced and we could prescribe prednisone pack   Subjective:   I, Claretha Schimke am a scribe for Dr. Leonce.    Chief Complaint: left shoulder pain   HPI:   11/18/2023 Patient is a 68 year old female with left shoulder pain. Patient states that the doctor said that it is tendinitis, small tear, and there is a cyst that is benign that the doctor is not worried about. Reaching behind her like putting seatbelt on hurts. Elbow flexion hurts as well. Doctor stated that they will refer her to physical therapy once she is done with current medication.  Duration?July  Did you have an Injury to cause this pain? no Taking Medication for pain? diclofenac  Numbness or Tingling?no Does the pain Radiate? Down to the biceps area Altered gait or use?yes ROM/ impairment of movement?yes    Relevant Historical Information: osteoarthritis in the knees stage 4  Additional pertinent review of systems negative.   Current Outpatient Medications:    Cholecalciferol  20 MCG (800 UNIT) TABS, Take 1 tablet by mouth daily., Disp: 90 tablet, Rfl: 0   diclofenac  (VOLTAREN ) 75 MG EC tablet, Take 1 tablet (75  mg total) by mouth 2 (two) times daily with a meal., Disp: 48 tablet, Rfl: 0   diclofenac  Sodium (VOLTAREN ) 1 % GEL, Apply 2 g topically 4 (four) times daily. To affected joint., Disp: 100 g, Rfl: 11    mupirocin  ointment (BACTROBAN ) 2 %, Apply to affected area TID for 7 days., Disp: 30 g, Rfl: 3   nystatin  powder, Apply 1 Application topically 3 (three) times daily. Use as needed to intertrigonal regions, Disp: 15 g, Rfl: 0   omeprazole  (PRILOSEC) 20 MG capsule, Take 1 capsule (20 mg total) by mouth daily., Disp: 30 capsule, Rfl: 4   Vitamin D , Ergocalciferol , (DRISDOL ) 1.25 MG (50000 UNIT) CAPS capsule, Take 1 capsule (50,000 Units total) by mouth every 7 (seven) days., Disp: 12 capsule, Rfl: 0   Objective:     Vitals:   11/18/23 1112  BP: 130/60  Pulse: 94  SpO2: 95%  Weight: 189 lb (85.7 kg)  Height: 5' 1 (1.549 m)      Body mass index is 35.71 kg/m.    Physical Exam:    Gen: Appears well, nad, nontoxic and pleasant Neuro:sensation intact, strength is 5/5 with df/pf/inv/ev, muscle tone wnl Skin: no suspicious lesion or defmority Psych: A&O, appropriate mood and affect  Left shoulder:  No deformity, swelling or muscle wasting No scapular winging FF 180, abd 120 with painful arc, int 15 with painful arc, ext 90 NTTP over the [place], clavicle, ac, coracoid, biceps groove, humerus, deltoid, trapezius, cervical spine Positive Hawkins, empty can Neg neer,  obriens, crossarm, subscap liftoff, speeds Neg ant drawer, sulcus sign, apprehension Negative Spurling's test bilat FROM of neck    Electronically signed by:  Odis Mace Coffey [facility] 11:48 AM 11/18/23

## 2023-11-18 ENCOUNTER — Ambulatory Visit: Admitting: Sports Medicine

## 2023-11-18 VITALS — BP 130/60 | HR 94 | Ht 61.0 in | Wt 189.0 lb

## 2023-11-18 DIAGNOSIS — M25561 Pain in right knee: Secondary | ICD-10-CM | POA: Diagnosis not present

## 2023-11-18 DIAGNOSIS — M25562 Pain in left knee: Secondary | ICD-10-CM

## 2023-11-18 DIAGNOSIS — M545 Low back pain, unspecified: Secondary | ICD-10-CM

## 2023-11-18 DIAGNOSIS — M7582 Other shoulder lesions, left shoulder: Secondary | ICD-10-CM | POA: Diagnosis not present

## 2023-11-18 DIAGNOSIS — G8929 Other chronic pain: Secondary | ICD-10-CM

## 2023-11-18 DIAGNOSIS — M170 Bilateral primary osteoarthritis of knee: Secondary | ICD-10-CM

## 2023-11-18 DIAGNOSIS — M25512 Pain in left shoulder: Secondary | ICD-10-CM

## 2023-11-18 NOTE — Patient Instructions (Signed)
 If no improvement in 1 to 2 weeks, call and ask for a prednisone dose pack Recommend calling back Physical therapy to establish care.  Get authorization for bilateral Zilretta  injection for the knees. Follow up in 6 weeks.

## 2023-11-27 ENCOUNTER — Telehealth: Payer: Self-pay | Admitting: Sports Medicine

## 2023-11-27 MED ORDER — METHYLPREDNISOLONE 4 MG PO TBPK: ORAL_TABLET | ORAL | 0 refills | Status: AC

## 2023-11-27 NOTE — Telephone Encounter (Signed)
 Patient called asking if Dr Leonce would be able to send in a prescription for Prednisone? She said that she is feeling a little better but thinks the Prednisone would help?  Pharmacy: [facility] [place]

## 2023-12-01 ENCOUNTER — Ambulatory Visit

## 2023-12-14 NOTE — Progress Notes (Deleted)
    Beverly Coffey [facility] [address] [place] [address] Phone: [phone]   Assessment and Plan:     ***    Pertinent previous records reviewed include ***   Follow Up: ***     Subjective:   I, Aniel Hubble, am serving as a neurosurgeon for Doctor Morene Mace  Chief Complaint: left shoulder pain    HPI:    11/18/2023 Patient is a 68 year old female with left shoulder pain. Patient states that the doctor said that it is tendinitis, small tear, and there is a cyst that is benign that the doctor is not worried about. Reaching behind her like putting seatbelt on hurts. Elbow flexion hurts as well. Doctor stated that they will refer her to physical therapy once she is done with current medication.   Duration?July  Did you have an Injury to cause this pain? no Taking Medication for pain? diclofenac  Numbness or Tingling?no Does the pain Radiate? Down to the biceps area Altered gait or use?yes ROM/ impairment of movement?yes   12/21/2023 Patient states    Relevant Historical Information: osteoarthritis in the knees stage 4  Additional pertinent review of systems negative.   Current Outpatient Medications:    Cholecalciferol  20 MCG (800 UNIT) TABS, Take 1 tablet by mouth daily., Disp: 90 tablet, Rfl: 0   diclofenac  (VOLTAREN ) 75 MG EC tablet, Take 1 tablet (75 mg total) by mouth 2 (two) times daily with a meal., Disp: 48 tablet, Rfl: 0   diclofenac  Sodium (VOLTAREN ) 1 % GEL, Apply 2 g topically 4 (four) times daily. To affected joint., Disp: 100 g, Rfl: 11   methylPREDNISolone  (MEDROL  DOSEPAK) 4 MG TBPK tablet, Follow instructions on the package., Disp: 21 tablet, Rfl: 0   mupirocin  ointment (BACTROBAN ) 2 %, Apply to affected area TID for 7 days., Disp: 30 g, Rfl: 3   nystatin  powder, Apply 1 Application topically 3 (three) times daily. Use as needed to intertrigonal regions, Disp: 15 g, Rfl: 0   omeprazole  (PRILOSEC) 20 MG  capsule, Take 1 capsule (20 mg total) by mouth daily., Disp: 30 capsule, Rfl: 4   Vitamin D , Ergocalciferol , (DRISDOL ) 1.25 MG (50000 UNIT) CAPS capsule, Take 1 capsule (50,000 Units total) by mouth every 7 (seven) days., Disp: 12 capsule, Rfl: 0   Objective:     There were no vitals filed for this visit.    There is no height or weight on file to calculate BMI.    Physical Exam:    ***   Electronically signed by:  Odis Mace Coffey [facility] 7:43 AM 12/14/23

## 2023-12-21 ENCOUNTER — Telehealth: Payer: Self-pay | Admitting: Sports Medicine

## 2023-12-21 ENCOUNTER — Ambulatory Visit: Admitting: Sports Medicine

## 2023-12-21 NOTE — Telephone Encounter (Signed)
 Patient was scheduled for bilateral Zilretta  injections as mentioned at her last visit on 11/27/2023. It does not look like anything has been done?  Can you check authorization as soon as possible so we can get her rescheduled?

## 2023-12-22 NOTE — Telephone Encounter (Signed)
 Did not find her information in the system on the website for Zilretta . Ran it today 12/22/23. Not sure what happened the first time. Case ID: [id number]. Pending approval.

## 2023-12-24 NOTE — Telephone Encounter (Signed)
 Insurance doe not cover [phone] (Zilretta ) or CPT code 79389. Form from flex forward states the previous.  Sent fax to insurance for Synvisc for bilateral knees on 12/24/23. Pending approval.

## 2023-12-28 ENCOUNTER — Ambulatory Visit (INDEPENDENT_AMBULATORY_CARE_PROVIDER_SITE_OTHER): Admitting: Sports Medicine

## 2023-12-28 VITALS — Ht 61.0 in

## 2023-12-28 DIAGNOSIS — M170 Bilateral primary osteoarthritis of knee: Secondary | ICD-10-CM

## 2023-12-28 DIAGNOSIS — G8929 Other chronic pain: Secondary | ICD-10-CM

## 2023-12-28 DIAGNOSIS — M25561 Pain in right knee: Secondary | ICD-10-CM | POA: Diagnosis not present

## 2023-12-28 DIAGNOSIS — M25562 Pain in left knee: Secondary | ICD-10-CM

## 2023-12-28 MED ORDER — HYLAN G-F 20 16 MG/2ML IX SOSY
16.0000 mg | PREFILLED_SYRINGE | Freq: Once | INTRA_ARTICULAR | Status: AC
  Administered 2023-12-28: 16 mg via INTRA_ARTICULAR

## 2023-12-28 NOTE — Telephone Encounter (Signed)
 Left message for patient to call back to schedule. I do have the medication and have a spot today held for her, if that time works. Otherwise, would be first available and can look for cancellations.

## 2023-12-28 NOTE — Telephone Encounter (Signed)
 Can you schedule patient when medication is stocked  Synvisc or Synvisc-one authorized for bilateral knee Reference # [id number] 12/24/23-12/23/24

## 2023-12-28 NOTE — Telephone Encounter (Signed)
 Scheduled 11/17/225.

## 2023-12-28 NOTE — Progress Notes (Signed)
 [facility] [address] [place] [address] Phone: [phone]   Assessment and Plan:     1. Bilateral primary osteoarthritis of knee (Primary) 2. Bilateral chronic knee pain -Chronic with exacerbation, subsequent visit - Patient returns for bilateral intra-articular HA injection for flare of osteoarthritis - Patient has received 2 to 3 months relief from prior intra-articular CSI.  I believe that patient would receive longer relief from Zilretta  injections, however unfortunately patient's insurance has denied Zilretta  injections.  We have elected to proceed with HA injections instead.  Tolerated well per note below  Procedure: Knee Joint Injection Side: Bilateral Indication: Flare of osteoarthritis  Risks explained and consent was given verbally. The site was cleaned with alcohol prep. A needle was introduced with an anterio-lateral approach. Injection given using Synvisc One 6 mL.  This was well tolerated.  Needle was removed, hemostasis achieved, and post injection instructions were explained.  Procedure was repeated on contralateral side.  Pt was advised to call or return to clinic if these symptoms worsen or fail to improve as anticipated.    Pertinent previous records reviewed include none   Follow Up: As needed if no improvement or worsening of symptoms.  If patient receives at least 6 months relief from HA injections, could repeat prior authorization and repeat HA injections.  Could consider CSI if patient does not receive 6 months relief.  Could further evaluate left shoulder pain if pain returns or consider repeat left shoulder MRI in 1 year to ensure no change in enchondroma   Subjective:   I, Beverly Coffey, am serving as a neurosurgeon for Doctor Morene Mace  Chief Complaint: left shoulder pain    HPI:    11/18/2023 Patient is a 68 year old female with left shoulder pain. Patient states that the doctor said that  it is tendinitis, small tear, and there is a cyst that is benign that the doctor is not worried about. Reaching behind her like putting seatbelt on hurts. Elbow flexion hurts as well. Doctor stated that they will refer her to physical therapy once she is done with current medication.   Duration?July  Did you have an Injury to cause this pain? no Taking Medication for pain? diclofenac  Numbness or Tingling?no Does the pain Radiate? Down to the biceps area Altered gait or use?yes ROM/ impairment of movement?yes   12/29/2023 Patient states    Relevant Historical Information: osteoarthritis in the knees stage 4    Additional pertinent review of systems negative.   Current Outpatient Medications:    Cholecalciferol  20 MCG (800 UNIT) TABS, Take 1 tablet by mouth daily., Disp: 90 tablet, Rfl: 0   diclofenac  (VOLTAREN ) 75 MG EC tablet, Take 1 tablet (75 mg total) by mouth 2 (two) times daily with a meal., Disp: 48 tablet, Rfl: 0   diclofenac  Sodium (VOLTAREN ) 1 % GEL, Apply 2 g topically 4 (four) times daily. To affected joint., Disp: 100 g, Rfl: 11   methylPREDNISolone  (MEDROL  DOSEPAK) 4 MG TBPK tablet, Follow instructions on the package., Disp: 21 tablet, Rfl: 0   mupirocin  ointment (BACTROBAN ) 2 %, Apply to affected area TID for 7 days., Disp: 30 g, Rfl: 3   nystatin  powder, Apply 1 Application topically 3 (three) times daily. Use as needed to intertrigonal regions, Disp: 15 g, Rfl: 0   omeprazole  (PRILOSEC) 20 MG capsule, Take 1 capsule (20 mg total) by mouth daily., Disp: 30 capsule, Rfl: 4   Vitamin D , Ergocalciferol , (DRISDOL )  1.25 MG (50000 UNIT) CAPS capsule, Take 1 capsule (50,000 Units total) by mouth every 7 (seven) days., Disp: 12 capsule, Rfl: 0   Objective:     Vitals:   12/28/23 1546  Height: 5' 1 (1.549 m)      Body mass index is 35.71 kg/m.        Electronically signed by:  Odis Mace D.[facility] 3:58 PM 12/28/23

## 2024-01-05 ENCOUNTER — Ambulatory Visit: Admitting: Medical-Surgical

## 2024-01-05 ENCOUNTER — Ambulatory Visit: Payer: Self-pay

## 2024-01-05 NOTE — Telephone Encounter (Signed)
 FYI Only or Action Required?: Action required by provider: request for appointment.  Patient was last seen in primary care on 11/03/2023 by Beverly Benton CROME, PA.  Called Nurse Triage reporting Foot Injury.  Symptoms began yesterday.  Interventions attempted: Nothing.  Symptoms are: unchanged.Dropped hairspray on toe, bruised, painful. Hurts to walk on foot.  Triage Disposition: See Physician Within 24 Hours  Patient/caregiver understands and will follow disposition?: Yes      Copied from CRM [id number]. Topic: Clinical - Red Word Triage >> Jan 05, 2024 12:31 PM Montie POUR wrote: Red Word that prompted transfer to Nurse Triage:  She dropped something on her right bid toe; it is black and blue; her second toe is now turn out. Painful went walking. Not sure of the level of pain. Answer Assessment - Initial Assessment Questions 1. MECHANISM: How did the injury happen? (e.g., twisting injury, direct blow)      Dropped hairspray on right big toe 2. ONSET: When did the injury happen? (e.g., minutes or hours ago)      yesterday 3. LOCATION: Where is the injury located?      right 4. APPEARANCE of INJURY: What does the injury look like?      bruised 5. WEIGHT-BEARING: Can you put weight on that foot? Can you walk (four steps or more)?       hurts 6. SIZE: For cuts, bruises, or swelling, ask: How large is it? (e.g., inches or centimeters;  entire joint)      no 7. PAIN: Is there pain? If Yes, ask: How bad is the pain? What does it keep you from doing? (Scale 0-10; or none, mild, moderate, severe)     6 8. TETANUS: For any breaks in the skin, ask: When was your last tetanus booster?     N/a 9. OTHER SYMPTOMS: Do you have any other symptoms?      no 10. PREGNANCY: Is there any chance you are pregnant? When was your last menstrual period?       no  Protocols used: Foot Injury-A-AH  Reason for Disposition  [1] MODERATE pain (e.g., interferes with normal  activities, limping) AND [2] high-risk adult (e.g., age > 60 years, osteoporosis, chronic steroid use)  Answer Assessment - Initial Assessment Questions 1. MECHANISM: How did the injury happen? (e.g., twisting injury, direct blow)      Dropped hairspray on right big toe 2. ONSET: When did the injury happen? (e.g., minutes or hours ago)      yesterday 3. LOCATION: Where is the injury located?      right 4. APPEARANCE of INJURY: What does the injury look like?      bruised 5. WEIGHT-BEARING: Can you put weight on that foot? Can you walk (four steps or more)?       hurts 6. SIZE: For cuts, bruises, or swelling, ask: How large is it? (e.g., inches or centimeters;  entire joint)      no 7. PAIN: Is there pain? If Yes, ask: How bad is the pain? What does it keep you from doing? (Scale 0-10; or none, mild, moderate, severe)     6 8. TETANUS: For any breaks in the skin, ask: When was your last tetanus booster?     N/a 9. OTHER SYMPTOMS: Do you have any other symptoms?      no 10. PREGNANCY: Is there any chance you are pregnant? When was your last menstrual period?       no  Protocols used: Foot Injury-A-AH

## 2024-01-05 NOTE — Telephone Encounter (Signed)
 Patient had appt today but shows in chart that this was cancelled.

## 2024-01-15 ENCOUNTER — Telehealth: Payer: Self-pay | Admitting: Sports Medicine

## 2024-01-15 NOTE — Telephone Encounter (Signed)
 Pt had bil knee Synvisc on 12/28/2023. Noted that she has felt no improvement and thinks they R knee may be worse. Looks like Zilretta  was denied previously, pt asking what next step would be for her knees.

## 2024-01-18 NOTE — Progress Notes (Unsigned)
 Beverly Coffey D.[facility] [address] [place] [address] Phone: [phone]   Assessment and Plan:     1. Bilateral primary osteoarthritis of knee (Primary) 2. Bilateral chronic knee pain -Chronic with exacerbation, subsequent visit - Still consistent with recurrent flare of severe bilateral knee osteoarthritis. - Patient had no relief from HA injections performed on 12/28/2023.  Patient had no significant relief from bilateral GAE procedure in 2025. - Patient has had mild relief for 2 to 3 months after intra-articular CSI.  I believe patient would receive longer lasting relief from Zilretta  injections.  Will submit for prior authorization in 2026.  Patient elected for bilateral CSI today.  Tolerated well per note below  - Use diclofenac  75 mg daily as needed for breakthrough pain.  Recommend limiting chronic NSAIDs to 1-2 doses per week to prevent long-term side effects. Use Tylenol  500 to 1000 mg tablets 2-3 times a day as needed for day-to-day pain relief.      Procedure: Knee Joint Injection Side: Bilateral Indication: Flare of osteoarthritis  Risks explained and consent was given verbally. The site was cleaned with alcohol prep. A needle was introduced with an anterio-lateral approach. Injection given using 2mL of 1% lidocaine  without epinephrine  and 1mL of kenalog  40mg /ml. This was well tolerated.  Needle was removed, hemostasis achieved, and post injection instructions were explained.  Procedure was repeated on contralateral side.  Pt was advised to call or return to clinic if these symptoms worsen or fail to improve as anticipated.   Pertinent previous records reviewed include none   Follow Up: 3 months for reevaluation.  Could consider Zilretta  injections if approved.  Could consider CSI.  Could consider referral to orthopedic surgery   Subjective:   I, Beverly Coffey, am serving as a neurosurgeon for Doctor Morene Mace   Chief  Complaint: left shoulder pain    HPI:    11/18/2023 Patient is a 68 year old female with left shoulder pain. Patient states that the doctor said that it is tendinitis, small tear, and there is a cyst that is benign that the doctor is not worried about. Reaching behind her like putting seatbelt on hurts. Elbow flexion hurts as well. Doctor stated that they will refer her to physical therapy once she is done with current medication.   Duration?July  Did you have an Injury to cause this pain? no Taking Medication for pain? diclofenac  Numbness or Tingling?no Does the pain Radiate? Down to the biceps area Altered gait or use?yes ROM/ impairment of movement?yes   01/19/2024 Patient states she is ready for injections today     Relevant Historical Information: osteoarthritis in the knees stage 4      Additional pertinent review of systems negative.   Current Outpatient Medications:    Cholecalciferol  20 MCG (800 UNIT) TABS, Take 1 tablet by mouth daily., Disp: 90 tablet, Rfl: 0   diclofenac  (VOLTAREN ) 75 MG EC tablet, Take 1 tablet (75 mg total) by mouth 2 (two) times daily with a meal., Disp: 48 tablet, Rfl: 0   diclofenac  Sodium (VOLTAREN ) 1 % GEL, Apply 2 g topically 4 (four) times daily. To affected joint., Disp: 100 g, Rfl: 11   methylPREDNISolone  (MEDROL  DOSEPAK) 4 MG TBPK tablet, Follow instructions on the package., Disp: 21 tablet, Rfl: 0   mupirocin  ointment (BACTROBAN ) 2 %, Apply to affected area TID for 7 days., Disp: 30 g, Rfl: 3   nystatin  powder, Apply 1 Application topically 3 (three) times  daily. Use as needed to intertrigonal regions, Disp: 15 g, Rfl: 0   omeprazole  (PRILOSEC) 20 MG capsule, Take 1 capsule (20 mg total) by mouth daily., Disp: 30 capsule, Rfl: 4   Vitamin D , Ergocalciferol , (DRISDOL ) 1.25 MG (50000 UNIT) CAPS capsule, Take 1 capsule (50,000 Units total) by mouth every 7 (seven) days., Disp: 12 capsule, Rfl: 0   Objective:     Vitals:   01/19/24 1324   Pulse: 94  SpO2: 94%  Weight: 189 lb (85.7 kg)  Height: 5' 1 (1.549 m)      Body mass index is 35.71 kg/m.    Physical Exam:     Gen: Appears well, nad, nontoxic and pleasant Neuro:sensation intact, strength is 5/5 with df/pf/inv/ev, muscle tone wnl Skin: no suspicious lesion or defmority Psych: A&O, appropriate mood and affect   Left shoulder:  No deformity, swelling or muscle wasting No scapular winging FF 180, abd 120 with painful arc, int 15 with painful arc, ext 90 NTTP over the [place], clavicle, ac, coracoid, biceps groove, humerus, deltoid, trapezius, cervical spine Positive Hawkins, empty can Neg neer,  obriens, crossarm, subscap liftoff, speeds Neg ant drawer, sulcus sign, apprehension Negative Spurling's test bilat FROM of neck    Electronically signed by:  Odis Mace D.[facility] 1:46 PM 01/19/24

## 2024-01-19 ENCOUNTER — Ambulatory Visit: Admitting: Sports Medicine

## 2024-01-19 ENCOUNTER — Telehealth: Payer: Self-pay | Admitting: Sports Medicine

## 2024-01-19 VITALS — HR 94 | Ht 61.0 in | Wt 189.0 lb

## 2024-01-19 DIAGNOSIS — G8929 Other chronic pain: Secondary | ICD-10-CM

## 2024-01-19 DIAGNOSIS — M170 Bilateral primary osteoarthritis of knee: Secondary | ICD-10-CM

## 2024-01-19 NOTE — Telephone Encounter (Signed)
 Bilat zilretta  February 11 2024

## 2024-01-19 NOTE — Patient Instructions (Signed)
 Zilretta  January 1 bilat knee   3 month follow up

## 2024-03-09 NOTE — Telephone Encounter (Signed)
 Zilretta  benefits ran bilateral knee case # [phone] [id number]

## 2024-04-25 ENCOUNTER — Ambulatory Visit: Admitting: Sports Medicine

## 2024-07-29 ENCOUNTER — Encounter: Admitting: Urgent Care
